# Patient Record
Sex: Female | Born: 1990 | Race: White | Hispanic: No | Marital: Single | Smoking: Never smoker
Health system: Southern US, Community
[De-identification: ages and names within clinical notes are randomized; demographics above are authoritative.]

## PROBLEM LIST (undated history)

## (undated) DIAGNOSIS — D3A00 Benign carcinoid tumor of unspecified site: Secondary | ICD-10-CM

## (undated) HISTORY — DX: Benign carcinoid tumor of unspecified site: D3A.00

---

## 2012-01-23 ENCOUNTER — Emergency Department (HOSPITAL_BASED_OUTPATIENT_CLINIC_OR_DEPARTMENT_OTHER): Payer: BC Managed Care – PPO

## 2012-01-23 ENCOUNTER — Encounter (HOSPITAL_BASED_OUTPATIENT_CLINIC_OR_DEPARTMENT_OTHER): Payer: Self-pay | Admitting: *Deleted

## 2012-01-23 ENCOUNTER — Emergency Department (HOSPITAL_BASED_OUTPATIENT_CLINIC_OR_DEPARTMENT_OTHER)
Admission: EM | Admit: 2012-01-23 | Discharge: 2012-01-23 | Disposition: A | Discharge status: Home / Self Care | Payer: BC Managed Care – PPO | Attending: Emergency Medicine | Admitting: Emergency Medicine

## 2012-01-23 DIAGNOSIS — N390 Urinary tract infection, site not specified: Secondary | ICD-10-CM | POA: Insufficient documentation

## 2012-01-23 DIAGNOSIS — R51 Headache: Secondary | ICD-10-CM | POA: Insufficient documentation

## 2012-01-23 DIAGNOSIS — R112 Nausea with vomiting, unspecified: Secondary | ICD-10-CM | POA: Insufficient documentation

## 2012-01-23 LAB — URINALYSIS, ROUTINE W REFLEX MICROSCOPIC
Bilirubin Urine: NEGATIVE
Ketones, ur: NEGATIVE mg/dL
Nitrite: NEGATIVE
pH: 5.5 (ref 5.0–8.0)

## 2012-01-23 LAB — URINE MICROSCOPIC-ADD ON

## 2012-01-23 MED ORDER — NITROFURANTOIN MONOHYD MACRO 100 MG PO CAPS
100.0000 mg | ORAL_CAPSULE | Freq: Once | ORAL | Status: AC
  Administered 2012-01-23: 100 mg via ORAL
  Filled 2012-01-23: qty 1

## 2012-01-23 MED ORDER — SODIUM CHLORIDE 0.9 % IV BOLUS (SEPSIS): 2000.0000 mL | Freq: Once | INTRAVENOUS | Status: DC

## 2012-01-23 MED ORDER — ONDANSETRON HCL 4 MG/2ML IJ SOLN
4.0000 mg | Freq: Once | INTRAMUSCULAR | Status: AC
  Administered 2012-01-23: 4 mg via INTRAVENOUS
  Filled 2012-01-23: qty 2

## 2012-01-23 MED ORDER — ONDANSETRON 8 MG PO TBDP: 8.0000 mg | ORAL_TABLET | Freq: Three times a day (TID) | ORAL | Status: AC | PRN

## 2012-01-23 MED ORDER — SODIUM CHLORIDE 0.9 % IV BOLUS (SEPSIS)
1000.0000 mL | Freq: Once | INTRAVENOUS | Status: AC
  Administered 2012-01-23: 1000 mL via INTRAVENOUS

## 2012-01-23 MED ORDER — NITROFURANTOIN MONOHYD MACRO 100 MG PO CAPS: 100.0000 mg | ORAL_CAPSULE | Freq: Two times a day (BID) | ORAL | Status: AC
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
## 2012-01-25 LAB — URINE CULTURE: Colony Count: 65000

## 2015-12-07 DIAGNOSIS — R5383 Other fatigue: Secondary | ICD-10-CM | POA: Diagnosis not present

## 2015-12-07 DIAGNOSIS — G43009 Migraine without aura, not intractable, without status migrainosus: Secondary | ICD-10-CM | POA: Diagnosis not present

## 2016-01-31 DIAGNOSIS — R319 Hematuria, unspecified: Secondary | ICD-10-CM | POA: Diagnosis not present

## 2016-01-31 DIAGNOSIS — N390 Urinary tract infection, site not specified: Secondary | ICD-10-CM | POA: Diagnosis not present

## 2016-02-19 DIAGNOSIS — J029 Acute pharyngitis, unspecified: Secondary | ICD-10-CM | POA: Diagnosis not present

## 2016-02-19 DIAGNOSIS — J069 Acute upper respiratory infection, unspecified: Secondary | ICD-10-CM | POA: Diagnosis not present

## 2016-03-07 DIAGNOSIS — R87610 Atypical squamous cells of undetermined significance on cytologic smear of cervix (ASC-US): Secondary | ICD-10-CM | POA: Diagnosis not present

## 2016-03-07 DIAGNOSIS — Z01419 Encounter for gynecological examination (general) (routine) without abnormal findings: Secondary | ICD-10-CM | POA: Diagnosis not present

## 2016-03-07 DIAGNOSIS — Z3041 Encounter for surveillance of contraceptive pills: Secondary | ICD-10-CM | POA: Diagnosis not present

## 2016-03-07 DIAGNOSIS — Z113 Encounter for screening for infections with a predominantly sexual mode of transmission: Secondary | ICD-10-CM | POA: Diagnosis not present

## 2016-03-07 DIAGNOSIS — Z124 Encounter for screening for malignant neoplasm of cervix: Secondary | ICD-10-CM | POA: Diagnosis not present

## 2016-03-10 DIAGNOSIS — Z113 Encounter for screening for infections with a predominantly sexual mode of transmission: Secondary | ICD-10-CM | POA: Diagnosis not present

## 2016-03-24 DIAGNOSIS — Z0000 Encounter for general adult medical examination without abnormal findings: Secondary | ICD-10-CM | POA: Diagnosis not present

## 2016-03-24 DIAGNOSIS — G43009 Migraine without aura, not intractable, without status migrainosus: Secondary | ICD-10-CM | POA: Diagnosis not present

## 2016-07-01 DIAGNOSIS — G43009 Migraine without aura, not intractable, without status migrainosus: Secondary | ICD-10-CM | POA: Diagnosis not present

## 2016-08-26 DIAGNOSIS — Z30430 Encounter for insertion of intrauterine contraceptive device: Secondary | ICD-10-CM | POA: Diagnosis not present

## 2016-08-26 DIAGNOSIS — Z3202 Encounter for pregnancy test, result negative: Secondary | ICD-10-CM | POA: Diagnosis not present

## 2016-10-09 DIAGNOSIS — B373 Candidiasis of vulva and vagina: Secondary | ICD-10-CM | POA: Diagnosis not present

## 2016-10-09 DIAGNOSIS — Z3202 Encounter for pregnancy test, result negative: Secondary | ICD-10-CM | POA: Diagnosis not present

## 2016-10-09 DIAGNOSIS — N898 Other specified noninflammatory disorders of vagina: Secondary | ICD-10-CM | POA: Diagnosis not present

## 2016-10-09 DIAGNOSIS — Z30431 Encounter for routine checking of intrauterine contraceptive device: Secondary | ICD-10-CM | POA: Diagnosis not present

## 2016-10-09 DIAGNOSIS — N920 Excessive and frequent menstruation with regular cycle: Secondary | ICD-10-CM | POA: Diagnosis not present

## 2017-01-11 DIAGNOSIS — H52223 Regular astigmatism, bilateral: Secondary | ICD-10-CM | POA: Diagnosis not present

## 2017-01-30 DIAGNOSIS — G43009 Migraine without aura, not intractable, without status migrainosus: Secondary | ICD-10-CM | POA: Diagnosis not present

## 2017-08-02 DIAGNOSIS — J019 Acute sinusitis, unspecified: Secondary | ICD-10-CM | POA: Diagnosis not present

## 2017-08-02 DIAGNOSIS — R05 Cough: Secondary | ICD-10-CM | POA: Diagnosis not present

## 2017-08-03 DIAGNOSIS — J111 Influenza due to unidentified influenza virus with other respiratory manifestations: Secondary | ICD-10-CM | POA: Diagnosis not present

## 2017-08-04 DIAGNOSIS — J111 Influenza due to unidentified influenza virus with other respiratory manifestations: Secondary | ICD-10-CM | POA: Diagnosis not present

## 2017-11-12 DIAGNOSIS — Z113 Encounter for screening for infections with a predominantly sexual mode of transmission: Secondary | ICD-10-CM | POA: Diagnosis not present

## 2017-11-12 DIAGNOSIS — Z3202 Encounter for pregnancy test, result negative: Secondary | ICD-10-CM | POA: Diagnosis not present

## 2017-11-12 DIAGNOSIS — N9412 Deep dyspareunia: Secondary | ICD-10-CM | POA: Diagnosis not present

## 2017-11-12 DIAGNOSIS — Z30432 Encounter for removal of intrauterine contraceptive device: Secondary | ICD-10-CM | POA: Diagnosis not present

## 2017-11-12 DIAGNOSIS — N946 Dysmenorrhea, unspecified: Secondary | ICD-10-CM | POA: Diagnosis not present

## 2017-11-12 DIAGNOSIS — R140 Abdominal distension (gaseous): Secondary | ICD-10-CM | POA: Diagnosis not present

## 2018-01-08 DIAGNOSIS — G44219 Episodic tension-type headache, not intractable: Secondary | ICD-10-CM | POA: Diagnosis not present

## 2018-01-08 DIAGNOSIS — G43009 Migraine without aura, not intractable, without status migrainosus: Secondary | ICD-10-CM | POA: Diagnosis not present

## 2018-01-08 DIAGNOSIS — R51 Headache: Secondary | ICD-10-CM | POA: Diagnosis not present

## 2018-01-12 DIAGNOSIS — J309 Allergic rhinitis, unspecified: Secondary | ICD-10-CM | POA: Diagnosis not present

## 2018-01-13 DIAGNOSIS — F411 Generalized anxiety disorder: Secondary | ICD-10-CM | POA: Diagnosis not present

## 2018-01-13 DIAGNOSIS — G43009 Migraine without aura, not intractable, without status migrainosus: Secondary | ICD-10-CM | POA: Diagnosis not present

## 2018-01-13 DIAGNOSIS — Z0000 Encounter for general adult medical examination without abnormal findings: Secondary | ICD-10-CM | POA: Diagnosis not present

## 2018-01-13 DIAGNOSIS — G44219 Episodic tension-type headache, not intractable: Secondary | ICD-10-CM | POA: Diagnosis not present

## 2018-05-11 DIAGNOSIS — J069 Acute upper respiratory infection, unspecified: Secondary | ICD-10-CM | POA: Diagnosis not present

## 2018-06-17 DIAGNOSIS — H5213 Myopia, bilateral: Secondary | ICD-10-CM | POA: Diagnosis not present

## 2018-06-17 DIAGNOSIS — H52223 Regular astigmatism, bilateral: Secondary | ICD-10-CM | POA: Diagnosis not present

## 2018-06-21 DIAGNOSIS — H5210 Myopia, unspecified eye: Secondary | ICD-10-CM | POA: Diagnosis not present

## 2018-11-03 DIAGNOSIS — D485 Neoplasm of uncertain behavior of skin: Secondary | ICD-10-CM | POA: Diagnosis not present

## 2018-11-03 DIAGNOSIS — D225 Melanocytic nevi of trunk: Secondary | ICD-10-CM | POA: Diagnosis not present

## 2018-11-03 DIAGNOSIS — I781 Nevus, non-neoplastic: Secondary | ICD-10-CM | POA: Diagnosis not present

## 2019-01-18 DIAGNOSIS — Z0000 Encounter for general adult medical examination without abnormal findings: Secondary | ICD-10-CM | POA: Diagnosis not present

## 2019-01-25 DIAGNOSIS — D485 Neoplasm of uncertain behavior of skin: Secondary | ICD-10-CM | POA: Diagnosis not present

## 2019-01-25 DIAGNOSIS — I781 Nevus, non-neoplastic: Secondary | ICD-10-CM | POA: Diagnosis not present

## 2019-03-21 DIAGNOSIS — M9903 Segmental and somatic dysfunction of lumbar region: Secondary | ICD-10-CM | POA: Diagnosis not present

## 2019-03-21 DIAGNOSIS — M5411 Radiculopathy, occipito-atlanto-axial region: Secondary | ICD-10-CM | POA: Diagnosis not present

## 2019-03-21 DIAGNOSIS — M9901 Segmental and somatic dysfunction of cervical region: Secondary | ICD-10-CM | POA: Diagnosis not present

## 2019-03-23 DIAGNOSIS — M9901 Segmental and somatic dysfunction of cervical region: Secondary | ICD-10-CM | POA: Diagnosis not present

## 2019-03-23 DIAGNOSIS — M9903 Segmental and somatic dysfunction of lumbar region: Secondary | ICD-10-CM | POA: Diagnosis not present

## 2019-03-23 DIAGNOSIS — M5411 Radiculopathy, occipito-atlanto-axial region: Secondary | ICD-10-CM | POA: Diagnosis not present

## 2019-03-28 DIAGNOSIS — M9903 Segmental and somatic dysfunction of lumbar region: Secondary | ICD-10-CM | POA: Diagnosis not present

## 2019-03-28 DIAGNOSIS — M5411 Radiculopathy, occipito-atlanto-axial region: Secondary | ICD-10-CM | POA: Diagnosis not present

## 2019-03-28 DIAGNOSIS — M9901 Segmental and somatic dysfunction of cervical region: Secondary | ICD-10-CM | POA: Diagnosis not present

## 2019-04-23 DIAGNOSIS — J019 Acute sinusitis, unspecified: Secondary | ICD-10-CM | POA: Diagnosis not present

## 2019-04-23 DIAGNOSIS — U071 COVID-19: Secondary | ICD-10-CM | POA: Diagnosis not present

## 2019-06-21 DIAGNOSIS — H5213 Myopia, bilateral: Secondary | ICD-10-CM | POA: Diagnosis not present

## 2019-06-21 DIAGNOSIS — H52223 Regular astigmatism, bilateral: Secondary | ICD-10-CM | POA: Diagnosis not present

## 2019-07-14 DIAGNOSIS — H5210 Myopia, unspecified eye: Secondary | ICD-10-CM | POA: Diagnosis not present

## 2019-07-27 DIAGNOSIS — Z01419 Encounter for gynecological examination (general) (routine) without abnormal findings: Secondary | ICD-10-CM | POA: Diagnosis not present

## 2019-07-27 DIAGNOSIS — Z6834 Body mass index (BMI) 34.0-34.9, adult: Secondary | ICD-10-CM | POA: Diagnosis not present

## 2019-07-27 DIAGNOSIS — Z124 Encounter for screening for malignant neoplasm of cervix: Secondary | ICD-10-CM | POA: Diagnosis not present

## 2019-10-04 DIAGNOSIS — Z1283 Encounter for screening for malignant neoplasm of skin: Secondary | ICD-10-CM | POA: Diagnosis not present

## 2019-10-04 DIAGNOSIS — D225 Melanocytic nevi of trunk: Secondary | ICD-10-CM | POA: Diagnosis not present

## 2019-10-04 DIAGNOSIS — I781 Nevus, non-neoplastic: Secondary | ICD-10-CM | POA: Diagnosis not present

## 2019-10-20 DIAGNOSIS — J0100 Acute maxillary sinusitis, unspecified: Secondary | ICD-10-CM | POA: Diagnosis not present

## 2020-01-18 DIAGNOSIS — J0110 Acute frontal sinusitis, unspecified: Secondary | ICD-10-CM | POA: Diagnosis not present

## 2020-03-30 DIAGNOSIS — J40 Bronchitis, not specified as acute or chronic: Secondary | ICD-10-CM | POA: Diagnosis not present

## 2020-03-30 DIAGNOSIS — B9689 Other specified bacterial agents as the cause of diseases classified elsewhere: Secondary | ICD-10-CM | POA: Diagnosis not present

## 2020-03-30 DIAGNOSIS — J329 Chronic sinusitis, unspecified: Secondary | ICD-10-CM | POA: Diagnosis not present

## 2020-03-30 DIAGNOSIS — Z299 Encounter for prophylactic measures, unspecified: Secondary | ICD-10-CM | POA: Diagnosis not present

## 2020-05-31 DIAGNOSIS — Z20822 Contact with and (suspected) exposure to covid-19: Secondary | ICD-10-CM | POA: Diagnosis not present

## 2020-05-31 DIAGNOSIS — B349 Viral infection, unspecified: Secondary | ICD-10-CM | POA: Diagnosis not present

## 2020-05-31 DIAGNOSIS — R059 Cough, unspecified: Secondary | ICD-10-CM | POA: Diagnosis not present

## 2020-05-31 DIAGNOSIS — R5383 Other fatigue: Secondary | ICD-10-CM | POA: Diagnosis not present

## 2020-06-18 DIAGNOSIS — B349 Viral infection, unspecified: Secondary | ICD-10-CM | POA: Diagnosis not present

## 2020-06-18 DIAGNOSIS — Z20822 Contact with and (suspected) exposure to covid-19: Secondary | ICD-10-CM | POA: Diagnosis not present

## 2020-06-18 DIAGNOSIS — R059 Cough, unspecified: Secondary | ICD-10-CM | POA: Diagnosis not present

## 2020-06-28 DIAGNOSIS — J40 Bronchitis, not specified as acute or chronic: Secondary | ICD-10-CM | POA: Diagnosis not present

## 2021-08-28 ENCOUNTER — Emergency Department (HOSPITAL_COMMUNITY): Payer: Self-pay

## 2021-08-28 ENCOUNTER — Other Ambulatory Visit: Payer: Self-pay

## 2021-08-28 ENCOUNTER — Inpatient Hospital Stay (HOSPITAL_COMMUNITY)
Admission: EM | Admit: 2021-08-28 | Discharge: 2021-08-30 | DRG: 166 | Disposition: A | Discharge status: Home / Self Care | Payer: Self-pay | Attending: Internal Medicine | Admitting: Internal Medicine

## 2021-08-28 ENCOUNTER — Encounter (HOSPITAL_COMMUNITY): Payer: Self-pay

## 2021-08-28 DIAGNOSIS — R918 Other nonspecific abnormal finding of lung field: Principal | ICD-10-CM | POA: Diagnosis present

## 2021-08-28 DIAGNOSIS — Z6833 Body mass index (BMI) 33.0-33.9, adult: Secondary | ICD-10-CM

## 2021-08-28 DIAGNOSIS — J189 Pneumonia, unspecified organism: Secondary | ICD-10-CM | POA: Diagnosis present

## 2021-08-28 DIAGNOSIS — E669 Obesity, unspecified: Secondary | ICD-10-CM | POA: Diagnosis present

## 2021-08-28 DIAGNOSIS — J90 Pleural effusion, not elsewhere classified: Secondary | ICD-10-CM | POA: Diagnosis present

## 2021-08-28 DIAGNOSIS — R000 Tachycardia, unspecified: Secondary | ICD-10-CM | POA: Diagnosis present

## 2021-08-28 DIAGNOSIS — F419 Anxiety disorder, unspecified: Secondary | ICD-10-CM | POA: Diagnosis present

## 2021-08-28 DIAGNOSIS — E876 Hypokalemia: Secondary | ICD-10-CM | POA: Diagnosis present

## 2021-08-28 DIAGNOSIS — Z882 Allergy status to sulfonamides status: Secondary | ICD-10-CM

## 2021-08-28 DIAGNOSIS — Z20822 Contact with and (suspected) exposure to covid-19: Secondary | ICD-10-CM | POA: Diagnosis present

## 2021-08-28 DIAGNOSIS — I10 Essential (primary) hypertension: Secondary | ICD-10-CM | POA: Diagnosis present

## 2021-08-28 LAB — BASIC METABOLIC PANEL
Anion gap: 15 (ref 5–15)
BUN: <5 mg/dL — ABNORMAL LOW (ref 6–20)
CO2: 24 mmol/L (ref 22–32)
Calcium: 9 mg/dL (ref 8.9–10.3)
Chloride: 97 mmol/L — ABNORMAL LOW (ref 98–111)
Creatinine, Ser: 0.67 mg/dL (ref 0.44–1.00)
GFR, Estimated: >60 mL/min (ref >60)
Glucose, Bld: 118 mg/dL — ABNORMAL HIGH (ref 70–99)
Potassium: 3.5 mmol/L (ref 3.5–5.1)
Sodium: 136 mmol/L (ref 135–145)

## 2021-08-28 LAB — CBC WITH DIFFERENTIAL/PLATELET
Abs Immature Granulocytes: 0.18 10*3/uL — ABNORMAL HIGH (ref 0.00–0.07)
Basophils Absolute: 0.1 10*3/uL (ref 0.0–0.1)
Basophils Relative: 0 %
Eosinophils Absolute: 0 10*3/uL (ref 0.0–0.5)
Eosinophils Relative: 0 %
HCT: 40.1 % (ref 36.0–46.0)
Hemoglobin: 13.1 g/dL (ref 12.0–15.0)
Immature Granulocytes: 1 %
Lymphocytes Relative: 7 %
Lymphs Abs: 1.7 10*3/uL (ref 0.7–4.0)
MCH: 28.9 pg (ref 26.0–34.0)
MCHC: 32.7 g/dL (ref 30.0–36.0)
MCV: 88.5 fL (ref 80.0–100.0)
Monocytes Absolute: 1.3 10*3/uL — ABNORMAL HIGH (ref 0.1–1.0)
Monocytes Relative: 5 %
Neutro Abs: 21.8 10*3/uL — ABNORMAL HIGH (ref 1.7–7.7)
Neutrophils Relative %: 87 %
Platelets: 502 10*3/uL — ABNORMAL HIGH (ref 150–400)
RBC: 4.53 MIL/uL (ref 3.87–5.11)
RDW: 12.6 % (ref 11.5–15.5)
WBC: 25 10*3/uL — ABNORMAL HIGH (ref 4.0–10.5)
nRBC: 0 % (ref 0.0–0.2)

## 2021-08-28 LAB — HEPATIC FUNCTION PANEL
ALT: 18 U/L (ref 0–44)
AST: 19 U/L (ref 15–41)
Albumin: 2.9 g/dL — ABNORMAL LOW (ref 3.5–5.0)
Alkaline Phosphatase: 104 U/L (ref 38–126)
Bilirubin, Direct: 0.1 mg/dL (ref 0.0–0.2)
Indirect Bilirubin: 0.5 mg/dL (ref 0.3–0.9)
Total Bilirubin: 0.6 mg/dL (ref 0.3–1.2)
Total Protein: 7.3 g/dL (ref 6.5–8.1)

## 2021-08-28 LAB — TROPONIN I (HIGH SENSITIVITY)
Troponin I (High Sensitivity): 11 ng/L (ref <18)
Troponin I (High Sensitivity): 9 ng/L (ref <18)

## 2021-08-28 LAB — URINALYSIS, ROUTINE W REFLEX MICROSCOPIC
Bilirubin Urine: NEGATIVE
Glucose, UA: NEGATIVE mg/dL
Ketones, ur: 20 mg/dL — AB
Leukocytes,Ua: NEGATIVE
Nitrite: NEGATIVE
Protein, ur: NEGATIVE mg/dL
Specific Gravity, Urine: 1.021 (ref 1.005–1.030)
pH: 7 (ref 5.0–8.0)

## 2021-08-28 LAB — LACTIC ACID, PLASMA: Lactic Acid, Venous: 1.4 mmol/L (ref 0.5–1.9)

## 2021-08-28 LAB — I-STAT BETA HCG BLOOD, ED (MC, WL, AP ONLY): I-stat hCG, quantitative: 10.4 m[IU]/mL — ABNORMAL HIGH (ref <5)

## 2021-08-28 LAB — PROTIME-INR
INR: 1.5 — ABNORMAL HIGH (ref 0.8–1.2)
Prothrombin Time: 18.4 seconds — ABNORMAL HIGH (ref 11.4–15.2)

## 2021-08-28 LAB — RESP PANEL BY RT-PCR (FLU A&B, COVID) ARPGX2
Influenza A by PCR: NEGATIVE
Influenza B by PCR: NEGATIVE
SARS Coronavirus 2 by RT PCR: NEGATIVE

## 2021-08-28 LAB — PREGNANCY, URINE: Preg Test, Ur: NEGATIVE

## 2021-08-28 LAB — APTT: aPTT: 29 seconds (ref 24–36)

## 2021-08-28 MED ORDER — ACETAMINOPHEN 500 MG PO TABS
1000.0000 mg | ORAL_TABLET | Freq: Once | ORAL | Status: AC
Start: 2021-08-28 — End: 2021-08-28
  Administered 2021-08-28: 1000 mg via ORAL
  Filled 2021-08-28: qty 2

## 2021-08-28 MED ORDER — SODIUM CHLORIDE 0.9% FLUSH
3.0000 mL | Freq: Two times a day (BID) | INTRAVENOUS | Status: DC
  Administered 2021-08-29 – 2021-08-30 (×3): 3 mL via INTRAVENOUS

## 2021-08-28 MED ORDER — LACTATED RINGERS IV BOLUS (SEPSIS)
1000.0000 mL | Freq: Once | INTRAVENOUS | Status: AC
  Administered 2021-08-28: 1000 mL via INTRAVENOUS

## 2021-08-28 MED ORDER — LACTATED RINGERS IV SOLN
INTRAVENOUS | Status: AC
Start: 2021-08-28 — End: 2021-08-29

## 2021-08-28 MED ORDER — SODIUM CHLORIDE 0.9 % IV SOLN: 500.0000 mg | INTRAVENOUS | Status: DC

## 2021-08-28 MED ORDER — ONDANSETRON HCL 4 MG/2ML IJ SOLN: 4.0000 mg | Freq: Four times a day (QID) | INTRAMUSCULAR | Status: DC | PRN

## 2021-08-28 MED ORDER — ACETAMINOPHEN 325 MG PO TABS: 650.0000 mg | ORAL_TABLET | Freq: Four times a day (QID) | ORAL | Status: DC | PRN

## 2021-08-28 MED ORDER — IOHEXOL 350 MG/ML SOLN
65.0000 mL | Freq: Once | INTRAVENOUS | Status: AC | PRN
  Administered 2021-08-28: 65 mL via INTRAVENOUS

## 2021-08-28 MED ORDER — GUAIFENESIN ER 600 MG PO TB12
600.0000 mg | ORAL_TABLET | Freq: Two times a day (BID) | ORAL | Status: DC | PRN
  Administered 2021-08-29 (×2): 600 mg via ORAL
  Filled 2021-08-28 (×2): qty 1

## 2021-08-28 MED ORDER — SODIUM CHLORIDE 0.9 % IV SOLN
1.0000 g | Freq: Once | INTRAVENOUS | Status: AC
  Administered 2021-08-28: 1 g via INTRAVENOUS
  Filled 2021-08-28: qty 10

## 2021-08-28 MED ORDER — ACETAMINOPHEN 650 MG RE SUPP: 650.0000 mg | Freq: Four times a day (QID) | RECTAL | Status: DC | PRN

## 2021-08-28 MED ORDER — SODIUM CHLORIDE 0.9 % IV SOLN: 2.0000 g | Freq: Once | INTRAVENOUS | Status: DC

## 2021-08-28 MED ORDER — SODIUM CHLORIDE 0.9 % IV SOLN
2.0000 g | INTRAVENOUS | Status: DC
  Administered 2021-08-29: 2 g via INTRAVENOUS
  Filled 2021-08-28: qty 20

## 2021-08-28 MED ORDER — SODIUM CHLORIDE 0.9 % IV SOLN
500.0000 mg | INTRAVENOUS | Status: DC
  Administered 2021-08-28 – 2021-08-30 (×3): 500 mg via INTRAVENOUS
  Filled 2021-08-28 (×3): qty 5

## 2021-08-28 MED ORDER — ONDANSETRON HCL 4 MG PO TABS: 4.0000 mg | ORAL_TABLET | Freq: Four times a day (QID) | ORAL | Status: DC | PRN

## 2021-08-28 MED ORDER — SENNOSIDES-DOCUSATE SODIUM 8.6-50 MG PO TABS: 1.0000 | ORAL_TABLET | Freq: Every evening | ORAL | Status: DC | PRN

## 2021-08-28 MED ORDER — LACTATED RINGERS IV BOLUS: 1000.0000 mL | Freq: Once | INTRAVENOUS | Status: DC

## 2021-08-28 NOTE — H&P (View-Only) (Signed)
? ?NAME:  Heidi Gomez, MRN:  [id number], DOB:  09-19-1990, LOS: 0 ?ADMISSION DATE:  08/28/2021, CONSULTATION DATE: 08/28/2021 ?REFERRING MD: Dr. Posey Pronto, CHIEF COMPLAINT: Lung mass ? ?History of Present Illness:  ?This is a 31 year old female, no real significant past medical history.  Works from home.  Has no real significant exposures.  Non-smoker.  She had cough shortness of breath and a recent episode of back pain.  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  She also has associated mediastinal adenopathy concerning for malignancy.  Pulmonary was consulted for recommendations regarding bronchoscopy. ? ?Pertinent  Medical History  ?History reviewed. No pertinent past medical history.  ? ?Significant Hospital Events: ?Including procedures, antibiotic start and stop dates in addition to other pertinent events   ? ? ?Interim History / Subjective:  ?This is a 31 year old female with a left lower lobe mass. ? ?Objective   ?Blood pressure 134/84, pulse (!) 132, temperature (!) 100.6 ?F (38.1 ?C), temperature source Oral, resp. rate 19, height '5\' 2"'$  (1.575 m), weight 83.9 kg, SpO2 92 %. ?   ?   ? ?Intake/Output Summary (Last 24 hours) at 08/28/2021 1820 ?Last data filed at 08/28/2021 1812 ?Gross per 24 hour  ?Intake 3350 ml  ?Output --  ?Net 3350 ml  ? ?Filed Weights  ? 08/28/21 1426  ?Weight: 83.9 kg  ? ? ?Examination: ?General: Young female resting in bed. ?HENT: NCAT, tracking appropriately ?Lungs: Clear to auscultation bilaterally, diminished in the left lower lobe ?Cardiovascular: Regular rate rhythm, S1-S2 ?Abdomen: Soft, nontender nondistended ?Extremities: No significant edema ?Neuro: Alert oriented following commands ?GU: Deferred ? ?Resolved Hospital Problem list   ? ? 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 ? ?Plan: ?N.p.o. midnight ?Bronchoscopy scheduled for tomorrow. ?Likely include endobronchial cryobiopsies and tumor debulking for relief of left lower lobe obstruction. ?Also will need video bronchoscopy with endobronchial ultrasound and staging of the station 7 mediastinal adenopathy. ?I have placed a case request to be [place]. ?Hopefully will be able to schedule for tomorrow. ? ? ?Labs   ?CBC: ?Recent Labs  ?Lab 08/28/21 ?1435  ?WBC 25.0*  ?NEUTROABS 21.8*  ?HGB 13.1  ?HCT 40.1  ?MCV 88.5  ?PLT 502*  ? ? ?Basic Metabolic Panel: ?Recent Labs  ?Lab 08/28/21 ?1435  ?NA 136  ?K 3.5  ?CL 97*  ?CO2 24  ?GLUCOSE 118*  ?BUN <5*  ?CREATININE 0.67  ?CALCIUM 9.0  ? ?GFR: ?Estimated Creatinine Clearance: 103.2 mL/min (by C-G formula based on SCr of 0.67 mg/dL). ?Recent Labs  ?Lab 08/28/21 ?1435 08/28/21 ?1553  ?WBC 25.0*  --   ?LATICACIDVEN  --  1.4  ? ? ?Liver Function Tests: ?Recent Labs  ?Lab 08/28/21 ?1553  ?AST 19  ?ALT 18  ?ALKPHOS 104  ?BILITOT 0.6  ?PROT 7.3  ?ALBUMIN 2.9*  ? ?No results for input(s): LIPASE, AMYLASE in the last 168 hours. ?No results for input(s): AMMONIA in the last 168 hours. ? ?ABG ?No results found for: PHART, PCO2ART, PO2ART, HCO3, TCO2, ACIDBASEDEF, O2SAT  ? ?Coagulation Profile: ?Recent Labs  ?Lab 08/28/21 ?1553  ?INR 1.5*  ? ? ?Cardiac Enzymes: ?No results for input(s): CKTOTAL, CKMB, CKMBINDEX, TROPONINI in the last 168 hours. ? ?HbA1C: ?No results found for: HGBA1C ? ?CBG: ?No results for input(s): GLUCAP in the last  168 hours. ? ?Review of Systems:   ?Review of Systems  ?Constitutional:  Negative for chills, fever, malaise/fatigue and weight loss.  ?HENT:  Negative for hearing loss, sore throat and tinnitus.   ?Eyes:  Negative for blurred vision and double vision.  ?Respiratory:  Positive for cough and shortness of breath. Negative for hemoptysis, sputum production, wheezing and stridor.   ?Cardiovascular:  Negative for chest pain,  palpitations, orthopnea, leg swelling and PND.  ?Gastrointestinal:  Negative for abdominal pain, constipation, diarrhea, heartburn, nausea and vomiting.  ?Genitourinary:  Negative for dysuria, hematuria and urgency.  ?Musculoskeletal:  Negative for joint pain and myalgias.  ?Skin:  Negative for itching and rash.  ?Neurological:  Negative for dizziness, tingling, weakness and headaches.  ?Endo/Heme/Allergies:  Negative for environmental allergies. Does not bruise/bleed easily.  ?Psychiatric/Behavioral:  Negative for depression. The patient is not nervous/anxious and does not have insomnia.   ?All other systems reviewed and are negative.  ? ?Past Medical History:  ?She,  has no past medical history on file.  ? ?Surgical History:  ?History reviewed. No pertinent surgical history.  ? ?Social History:  ?   ? ?Family History:  ?Her family history is not on file.  ? ?Allergies ?Allergies  ?Allergen Reactions  ? Sulfa Antibiotics Hives  ?  ? ?Home Medications  ?Prior to Admission medications   ?Not on File  ? ? ? ? ?Garner Nash, DO ?[facility] ?08/28/2021 6:20 PM   ?

## 2021-08-28 NOTE — ED Notes (Signed)
Per MD Physiologic tachycardia - continue care plan  ? ?

## 2021-08-28 NOTE — H&P (Signed)
?History and Physical  ? ? ?Heidi Gomez HLK:[id number] DOB: [birth year] DOA: 08/28/2021 ? ?PCP: Pcp, No  ?Patient coming from: Home via urgent care ? ?I have personally briefly reviewed patient's old medical records in [place] ? ?Chief Complaint: Shortness of breath, cough ? ?HPI: ?Heidi Gomez is a 31 y.o. female with no known significant medical history who presented to the ED for evaluation of shortness of breath. ? ?Patient reports 5-6 days of new onset shortness of breath with productive cough, pain to left lower back with deep inspiration, chills, diaphoresis, fatigue, and nausea.  She says that she has had COVID 4 times since the beginning of the pandemic with last time in February.  She went to urgent care today for further evaluation.  Chest x-ray was performed and showed moderate left pleural effusion with left lower lobe airspace opacity.  Patient was noted to have sinus tachycardia with rate 138 bpm by EKG.  There was concern for PE and she was sent to the ED for further evaluation. ? ?Patient denies any known chronic medical conditions.  She does not take any medications regularly other than migraine medication as needed.  She denies any history of tobacco, alcohol, illicit drug use.  She reports an allergy to sulfa which causes her to break out in hives.  She reports a history of bladder cancer in her grandfather. ? ?ED Course  Labs/Imaging on admission: I have personally reviewed following labs and imaging studies. ? ?Initial vitals showed BP 151/94, pulse 153, RR 21, temp 100.6 ?F, SPO2 97% on room air. ? ?Labs show WBC 25.0, hemoglobin 13.1, platelets 502,000, sodium 136, potassium 3.5, bicarb 24, BUN <5, creatinine 0.67, serum glucose 118, troponin 11, lactic acid 1.4, i-STAT beta-hCG 10.4. ? ?SARS-CoV-2 and influenza PCR negative.  Blood cultures in process. ? ?Portable chest x-ray showed homogenous opacification of inferior 40% of the left lung. ? ?CTA chest PE study showed collapse and  consolidation throughout the left lower lobe which appears to be related to an endobronchial lesion and inclusion.  Enlarged mediastinal lymph nodes and concern for abnormal soft tissue in the left hilum seen.  Findings concerning for neoplastic process.  Negative for PE. ? ?Patient was given 3 L LR, IV ceftriaxone and azithromycin.  EDP consulted pulmonology who recommended medical admission and they will arrange for further evaluation of endobronchial mass.  The hospitalist service was consulted to admit for further evaluation and management. ? ?Review of Systems: All systems reviewed and are negative except as documented in history of present illness above. ? ? ?History reviewed. No pertinent past medical history. ? ?History reviewed. No pertinent surgical history. ? ?Social History: ? has no history on file for tobacco use, alcohol use, and drug use. ? ?Allergies  ?Allergen Reactions  ? Sulfa Antibiotics Hives  ? ? ?History reviewed. No pertinent family history. ? ? ?Prior to Admission medications   ?Not on File  ? ? ?Physical Exam: ?Vitals:  ? 08/28/21 1800 08/28/21 1815 08/28/21 1830 08/28/21 1845  ?BP:  (!) 147/91 (!) 149/86 (!) 157/94  ?Pulse: (!) 144 (!) 142 (!) 134 (!) 144  ?Resp: (!) 32 (!) '27 17 20  '$ ?Temp:      ?TempSrc:      ?SpO2: 96% 95% 95% 95%  ?Weight:      ?Height:      ? ?Constitutional: Resting in bed, NAD, calm, comfortable ?Eyes: PERRL, lids and conjunctivae normal ?ENMT: Mucous membranes are moist. Posterior pharynx clear of  any exudate or lesions.Normal dentition.  ?Neck: normal, supple, no masses. ?Respiratory: clear to auscultation bilaterally, no wheezing, no crackles. Normal respiratory effort. No accessory muscle use.  ?Cardiovascular: Tachycardic, no murmurs / rubs / gallops. No extremity edema. 2+ pedal pulses. ?Abdomen: no tenderness, no masses palpated. No hepatosplenomegaly.  ?Musculoskeletal: no clubbing / cyanosis. No joint deformity upper and lower extremities. Good ROM, no  contractures. Normal muscle tone.  ?Skin: no rashes, lesions, ulcers. No induration ?Neurologic: CN 2-12 grossly intact. Sensation intact. Strength 5/5 in all 4.  ?Psychiatric: Normal judgment and insight. Alert and oriented x 3. Normal mood.  ? ?EKG: Personally reviewed. Sinus tachycardia, rate 158, no prior for comparison. ? ?Assessment/Plan ?Principal Problem: ?  Endobronchial mass ?  ?Heidi Gomez is a 31 y.o. female with no known significant medical history who is admitted with endobronchial mass associated with left lower lobe collapse and consolidation concerning for neoplastic process with possible postobstructive pneumonia.  Pulmonology to consult. ? ?Assessment and Plan: ?* Endobronchial mass ?CTA chest shows occluding lesion near the origin of the left lower lobe bronchus associated with collapse and consolidation in the left lower lobe.  Findings concerning for neoplastic process.  This was discussed with patient and family at bedside.  Given fever, leukocytosis, tachycardia we will continue antibiotics for treatment of possible postobstructive pneumonia. ?-Continue IV ceftriaxone and azithromycin ?-Follow blood cultures ?-Keep n.p.o. after midnight ?-Pulmonology to consult for further recommendation ? ?DVT prophylaxis: SCDs Start: 08/28/21 1858 ?Code Status: Full code ?Family Communication: Discussed with patient's mother and brother at bedside ?Disposition Plan: From home, dispo pending clinical progress ?Consults called: PCCM ?Severity of Illness: ?The appropriate patient status for this patient is INPATIENT. Inpatient status is judged to be reasonable and necessary in order to provide the required intensity of service to ensure the patient's safety. The patient's presenting symptoms, physical exam findings, and initial radiographic and laboratory data in the context of their chronic comorbidities is felt to place them at high risk for further clinical deterioration. Furthermore, it is not  anticipated that the patient will be medically stable for discharge from the hospital within 2 midnights of admission.  ? ?* I certify that at the point of admission it is my clinical judgment that the patient will require inpatient hospital care spanning beyond 2 midnights from the point of admission due to high intensity of service, high risk for further deterioration and high frequency of surveillance required.*  ?Zada Finders MD ?[facility] ? ?If 7PM-7AM, please contact night-coverage ?[email or web address] ? ?08/28/2021, 7:09 PM  ?

## 2021-08-28 NOTE — Assessment & Plan Note (Signed)
CTA chest shows occluding lesion near the origin of the left lower lobe bronchus associated with collapse and consolidation in the left lower lobe.  Findings concerning for neoplastic process.  This was discussed with patient and family at bedside.  Given fever, leukocytosis, tachycardia we will continue antibiotics for treatment of possible postobstructive pneumonia. ?-Continue IV ceftriaxone and azithromycin ?-Follow blood cultures ?-Keep n.p.o. after midnight ?-Pulmonology to consult for further recommendation ?

## 2021-08-28 NOTE — Sepsis Progress Note (Signed)
Notified bedside nurse of need to draw lactic acid and Blood Cultures.. 

## 2021-08-28 NOTE — ED Provider Triage Note (Signed)
Emergency Medicine Provider Triage Evaluation Note ? ?Heidi Gomez , a 31 y.o. female  was evaluated in triage.  Pt complains of shortness of breath and fever.  Patient was initially seen at urgent care for flulike symptoms that started about 1-1/2 weeks ago with associated shortness of breath, cough, and left-sided back pain.  She is found to be tachycardic at urgent care and her chest x-ray showed a moderate left pleural effusion with left lower lobe airspace opacity concerning for pneumonia.  Due to patient's tachycardia and dyspnea on exertion, urgent care sent here for evaluation of a pulmonary embolism. ?History of blood clots.  She is not on oral contraceptives.  Denies any recent long travel. ? ?Review of Systems  ?Positive: Left-sided back pain, cough, fever, dyspnea on exertion ?Negative: Chest pain ? ?Physical Exam  ?BP (!) 151/94   Pulse (!) 153   Temp (!) 100.6 ?F (38.1 ?C) (Oral)   Resp (!) 21   Ht '5\' 2"'$  (1.575 m)   Wt 83.9 kg   SpO2 97%   BMI 33.84 kg/m?  ?Gen:   Awake, no distress   ?Resp:  Normal effort  ?MSK:   Moves extremities without difficulty  ?Other:  Patient appears flushed, productive cough ? ?Medical Decision Making  ?Medically screening exam initiated at 2:27 PM.  Appropriate orders placed.  Heidi Gomez was informed that the remainder of the evaluation will be completed by another provider, this initial triage assessment does not replace that evaluation, and the importance of remaining in the ED until their evaluation is complete. ? ?Patient was noted to be febrile and tachycardic to 153 in triage, notified staff patient will require next room.  ?  Kateri Plummer, PA-C ?08/28/21 1427 ? ?

## 2021-08-28 NOTE — ED Provider Notes (Signed)
?[facility] ?Provider Note ? ? ?CSN: [id number] ?Arrival date & time: 08/28/21  1339 ? ?  ? ?History ? ?Chief Complaint  ?Patient presents with  ? Chest Pain  ? Shortness of Breath  ? ? ?Heidi Gomez is a 31 y.o. female with no reported past medical history presenting today due to shortness of breath.  She reports that on Thursday or Friday she felt as though she had a fever, chills, nausea and other flulike symptoms.  This morning she decided to go to urgent care because she still had a lingering cough and some dyspnea on exertion.  She figured it may be pneumonia.  When she was at urgent care they did an x-ray and because she was tachycardic and had dyspnea on exertion, they sent her here to rule out a PE.  No history of DVT/PE.  No OCPs.  No recent travel or surgeries.  Currently denies any palpitations or chest pain.  Reports feeling slightly short of breath, but mostly only on exertion. ? ? ?Home Medications ?Prior to Admission medications   ?Not on File  ?   ? ?Allergies    ?Sulfa antibiotics   ? ?Review of Systems   ?Review of Systems  ?Constitutional:  Positive for chills and fever.  ?Respiratory:  Positive for cough and shortness of breath.   ?Cardiovascular:  Negative for chest pain.  ?Musculoskeletal:  Positive for myalgias.  ?See HPI for more ? ?Physical Exam ?Updated Vital Signs ?BP (!) 151/94   Pulse (!) 153   Temp (!) 100.6 ?F (38.1 ?C) (Oral)   Resp (!) 21   Ht '5\' 2"'$  (1.575 m)   Wt 83.9 kg   SpO2 97%   BMI 33.84 kg/m?  ?Physical Exam ?Vitals and nursing note reviewed.  ?Constitutional:   ?   General: She is not in acute distress. ?   Appearance: Normal appearance. She is not ill-appearing.  ?HENT:  ?   Head: Normocephalic and atraumatic.  ?Eyes:  ?   General: No scleral icterus. ?   Conjunctiva/sclera: Conjunctivae normal.  ?Cardiovascular:  ?   Rate and Rhythm: Normal rate and regular rhythm.  ?Pulmonary:  ?   Effort: Pulmonary effort is normal.  Tachypnea present. No accessory muscle usage or respiratory distress.  ?   Breath sounds: Examination of the right-upper field reveals wheezing. Examination of the left-upper field reveals decreased breath sounds and wheezing. Examination of the left-lower field reveals decreased breath sounds. Decreased breath sounds and wheezing present. No rhonchi.  ?Skin: ?   Findings: No rash.  ?Neurological:  ?   Mental Status: She is alert.  ?Psychiatric:     ?   Mood and Affect: Mood normal.     ?   Behavior: Behavior normal.  ? ? ?ED Results / Procedures / Treatments   ?Labs ?(all labs ordered are listed, but only abnormal results are displayed) ?Labs Reviewed  ?CBC WITH DIFFERENTIAL/PLATELET - Abnormal; Notable for the following components:  ?    Result Value  ? WBC 25.0 (*)   ? Platelets 502 (*)   ? Neutro Abs 21.8 (*)   ? Monocytes Absolute 1.3 (*)   ? Abs Immature Granulocytes 0.18 (*)   ? All other components within normal limits  ?BASIC METABOLIC PANEL - Abnormal; Notable for the following components:  ? Chloride 97 (*)   ? Glucose, Bld 118 (*)   ? BUN <5 (*)   ? All other components within normal limits  ?  PROTIME-INR - Abnormal; Notable for the following components:  ? Prothrombin Time 18.4 (*)   ? INR 1.5 (*)   ? All other components within normal limits  ?HEPATIC FUNCTION PANEL - Abnormal; Notable for the following components:  ? Albumin 2.9 (*)   ? All other components within normal limits  ?I-STAT BETA HCG BLOOD, ED ([facility], [facility], [facility] ONLY) - Abnormal; Notable for the following components:  ? I-stat hCG, quantitative 10.4 (*)   ? All other components within normal limits  ?RESP PANEL BY RT-PCR (FLU A&B, COVID) ARPGX2  ?CULTURE, BLOOD (ROUTINE X 2)  ?CULTURE, BLOOD (ROUTINE X 2)  ?LACTIC ACID, PLASMA  ?APTT  ?LACTIC ACID, PLASMA  ?URINALYSIS, ROUTINE W REFLEX MICROSCOPIC  ?TROPONIN I (HIGH SENSITIVITY)  ?TROPONIN I (HIGH SENSITIVITY)  ? ? ?EKG ?None ? ?Radiology ?CT Angio Chest PE W and/or Wo Contrast ? ?Result Date:  08/28/2021 ?CLINICAL DATA:  Chest pain. Shortness of breath. Pulmonary embolism is suspected. EXAM: CT ANGIOGRAPHY CHEST WITH CONTRAST TECHNIQUE: Multidetector CT imaging of the chest was performed using the standard protocol during bolus administration of intravenous contrast. Multiplanar CT image reconstructions and MIPs were obtained to evaluate the vascular anatomy. RADIATION DOSE REDUCTION: This exam was performed according to the departmental dose-optimization program which includes automated exposure control, adjustment of the mA and/or kV according to patient size and/or use of iterative reconstruction technique. CONTRAST:  42m OMNIPAQUE IOHEXOL 350 MG/ML SOLN COMPARISON:  Chest radiograph 08/28/2021 FINDINGS: Cardiovascular: Negative for pulmonary embolism. Normal caliber of the thoracic aorta. Heart size is normal. No significant pericardial effusion. Mediastinum/Nodes: Enlarged subcarinal tissue measuring 1.9 cm in the short axis. There is abnormal soft tissue in the left hilum which appears to be causing endobronchial occlusion at the origin of the left lower lobe bronchus. No significant supraclavicular lymphadenopathy. No axillary lymph node enlargement. No significant right hilar lymphadenopathy. Lungs/Pleura: Trachea is patent. Evidence for a lesion causing occlusion of the left lower lobe bronchus near the origin. There also appears to be narrowing of the left upper lobe bronchus. Collapse and consolidation of the left lower lobe. There is an additional volume loss in the left upper lobe. Right lung is clear. No pleural effusions. Upper Abdomen: Normal appearance of the adrenal glands. Images of the upper abdomen are unremarkable. There is no significant lymph node enlargement in the upper abdomen. Musculoskeletal: No suspicious osseous lesions. Review of the MIP images confirms the above findings. IMPRESSION: 1. Collapse and consolidation throughout the left lower lobe appears to be related to an  endobronchial lesion and occlusion. Enlarged mediastinal lymph nodes and concern for abnormal soft tissue in the left hilum. Findings raise concern for a neoplastic process. Recommend pulmonary consultation. 2. Negative for pulmonary embolism. These results were called by telephone at the time of interpretation on 08/28/2021 at 5:23 pm [facility] verbally acknowledged these results. Electronically Signed   By: AMarkus DaftM.D.   On: 08/28/2021 17:27  ? ?DG Chest Port 1 View ? ?Result Date: 08/28/2021 ?CLINICAL DATA:  Questionable sepsis.  Evaluate for abnormality. EXAM: PORTABLE CHEST 1 VIEW COMPARISON:  None. FINDINGS: Cardiac silhouette and mediastinal contours are within normal limits. There is fairly homogeneous opacification of the inferior 40% of the left lung. The right lung is clear. No pneumothorax is seen. Mild dextrocurvature of the mid to upper thoracic spine. IMPRESSION: Fairly homogeneous opacification of the inferior 40% of the left lung may represent a combination of pleural fluid, atelectasis, and/or infection. Electronically  Signed   By: Yvonne Kendall M.D.   On: 08/28/2021 15:29   ? ?Procedures ?[facility] ?Performed by: Rhae Hammock, PA-C ?Authorized by: Rhae Hammock, PA-C  ? ?Critical care provider statement:  ?  Critical care time (minutes):  90 ?  Critical care was necessary to treat or prevent imminent or life-threatening deterioration of the following conditions:  Sepsis and respiratory failure ?  Critical care was time spent personally by me on the following activities:  Development of treatment plan with patient or surrogate, discussions with consultants, evaluation of patient's response to treatment, examination of patient, obtaining history from patient or surrogate, ordering and review of laboratory studies, ordering and performing treatments and interventions, pulse oximetry and re-evaluation of patient's condition ?  I assumed direction of critical  care for this patient from another provider in my specialty: no   ?  Care discussed with: admitting provider    ? ?Normal sinus rhythm, tachycardic ? ?Medications Ordered in ED ?Medications  ?azithromycin (ZITHROMAX) 500

## 2021-08-28 NOTE — Sepsis Progress Note (Signed)
ELink tracking the code sepsis.  

## 2021-08-28 NOTE — ED Notes (Signed)
MD notified of pt HR - pt tachy in the upper 120s-130s - pt is in bed at this time - reporting no pain and no SOB ?Introduced myself to pt at this time  ?

## 2021-08-28 NOTE — ED Triage Notes (Signed)
Pt arrived POV from urgent care to be ruled out for PE. Pt endorses some CP. Pt's HR is in the 150's. Per pt urgent care was concerned with pt's chest x-ray.  ?

## 2021-08-28 NOTE — Consult Note (Signed)
? ?NAME:  Heidi Gomez, MRN:  [id number], DOB:  1991-04-01, LOS: 0 ?ADMISSION DATE:  08/28/2021, CONSULTATION DATE: 08/28/2021 ?REFERRING MD: Dr. Posey Pronto, CHIEF COMPLAINT: Lung mass ? ?History of Present Illness:  ?This is a 31 year old female, no real significant past medical history.  Works from home.  Has no real significant exposures.  Non-smoker.  She had cough shortness of breath and a recent episode of back pain.  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  She also has associated mediastinal adenopathy concerning for malignancy.  Pulmonary was consulted for recommendations regarding bronchoscopy. ? ?Pertinent  Medical History  ?History reviewed. No pertinent past medical history.  ? ?Significant Hospital Events: ?Including procedures, antibiotic start and stop dates in addition to other pertinent events   ? ? ?Interim History / Subjective:  ?This is a 31 year old female with a left lower lobe mass. ? ?Objective   ?Blood pressure 134/84, pulse (!) 132, temperature (!) 100.6 ?F (38.1 ?C), temperature source Oral, resp. rate 19, height '5\' 2"'$  (1.575 m), weight 83.9 kg, SpO2 92 %. ?   ?   ? ?Intake/Output Summary (Last 24 hours) at 08/28/2021 1820 ?Last data filed at 08/28/2021 1812 ?Gross per 24 hour  ?Intake 3350 ml  ?Output --  ?Net 3350 ml  ? ?Filed Weights  ? 08/28/21 1426  ?Weight: 83.9 kg  ? ? ?Examination: ?General: Young female resting in bed. ?HENT: NCAT, tracking appropriately ?Lungs: Clear to auscultation bilaterally, diminished in the left lower lobe ?Cardiovascular: Regular rate rhythm, S1-S2 ?Abdomen: Soft, nontender nondistended ?Extremities: No significant edema ?Neuro: Alert oriented following commands ?GU: Deferred ? ?Resolved Hospital Problem list   ? ? 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 ? ?Plan: ?N.p.o. midnight ?Bronchoscopy scheduled for tomorrow. ?Likely include endobronchial cryobiopsies and tumor debulking for relief of left lower lobe obstruction. ?Also will need video bronchoscopy with endobronchial ultrasound and staging of the station 7 mediastinal adenopathy. ?I have placed a case request to be [place]. ?Hopefully will be able to schedule for tomorrow. ? ? ?Labs   ?CBC: ?Recent Labs  ?Lab 08/28/21 ?1435  ?WBC 25.0*  ?NEUTROABS 21.8*  ?HGB 13.1  ?HCT 40.1  ?MCV 88.5  ?PLT 502*  ? ? ?Basic Metabolic Panel: ?Recent Labs  ?Lab 08/28/21 ?1435  ?NA 136  ?K 3.5  ?CL 97*  ?CO2 24  ?GLUCOSE 118*  ?BUN <5*  ?CREATININE 0.67  ?CALCIUM 9.0  ? ?GFR: ?Estimated Creatinine Clearance: 103.2 mL/min (by C-G formula based on SCr of 0.67 mg/dL). ?Recent Labs  ?Lab 08/28/21 ?1435 08/28/21 ?1553  ?WBC 25.0*  --   ?LATICACIDVEN  --  1.4  ? ? ?Liver Function Tests: ?Recent Labs  ?Lab 08/28/21 ?1553  ?AST 19  ?ALT 18  ?ALKPHOS 104  ?BILITOT 0.6  ?PROT 7.3  ?ALBUMIN 2.9*  ? ?No results for input(s): LIPASE, AMYLASE in the last 168 hours. ?No results for input(s): AMMONIA in the last 168 hours. ? ?ABG ?No results found for: PHART, PCO2ART, PO2ART, HCO3, TCO2, ACIDBASEDEF, O2SAT  ? ?Coagulation Profile: ?Recent Labs  ?Lab 08/28/21 ?1553  ?INR 1.5*  ? ? ?Cardiac Enzymes: ?No results for input(s): CKTOTAL, CKMB, CKMBINDEX, TROPONINI in the last 168 hours. ? ?HbA1C: ?No results found for: HGBA1C ? ?CBG: ?No results for input(s): GLUCAP in the last  168 hours. ? ?Review of Systems:   ?Review of Systems  ?Constitutional:  Negative for chills, fever, malaise/fatigue and weight loss.  ?HENT:  Negative for hearing loss, sore throat and tinnitus.   ?Eyes:  Negative for blurred vision and double vision.  ?Respiratory:  Positive for cough and shortness of breath. Negative for hemoptysis, sputum production, wheezing and stridor.   ?Cardiovascular:  Negative for chest pain,  palpitations, orthopnea, leg swelling and PND.  ?Gastrointestinal:  Negative for abdominal pain, constipation, diarrhea, heartburn, nausea and vomiting.  ?Genitourinary:  Negative for dysuria, hematuria and urgency.  ?Musculoskeletal:  Negative for joint pain and myalgias.  ?Skin:  Negative for itching and rash.  ?Neurological:  Negative for dizziness, tingling, weakness and headaches.  ?Endo/Heme/Allergies:  Negative for environmental allergies. Does not bruise/bleed easily.  ?Psychiatric/Behavioral:  Negative for depression. The patient is not nervous/anxious and does not have insomnia.   ?All other systems reviewed and are negative.  ? ?Past Medical History:  ?She,  has no past medical history on file.  ? ?Surgical History:  ?History reviewed. No pertinent surgical history.  ? ?Social History:  ?   ? ?Family History:  ?Her family history is not on file.  ? ?Allergies ?Allergies  ?Allergen Reactions  ? Sulfa Antibiotics Hives  ?  ? ?Home Medications  ?Prior to Admission medications   ?Not on File  ? ? ? ? ?Garner Nash, DO ?[facility] ?08/28/2021 6:20 PM   ?

## 2021-08-28 NOTE — Hospital Course (Signed)
Heidi Gomez is a 31 y.o. female with no known significant medical history who is admitted with endobronchial mass associated with left lower lobe collapse and consolidation concerning for neoplastic process with possible postobstructive pneumonia.  Pulmonology to consult. ?

## 2021-08-29 ENCOUNTER — Encounter (HOSPITAL_COMMUNITY): Payer: Self-pay | Admitting: Internal Medicine

## 2021-08-29 ENCOUNTER — Ambulatory Visit (HOSPITAL_COMMUNITY): Admit: 2021-08-29 | Payer: Self-pay | Admitting: Pulmonary Disease

## 2021-08-29 ENCOUNTER — Inpatient Hospital Stay (HOSPITAL_COMMUNITY): Payer: Self-pay | Admitting: Anesthesiology

## 2021-08-29 ENCOUNTER — Encounter (HOSPITAL_COMMUNITY)
Admission: EM | Disposition: A | Discharge status: Home / Self Care | Payer: Self-pay | Source: Home / Self Care | Attending: Internal Medicine

## 2021-08-29 DIAGNOSIS — R911 Solitary pulmonary nodule: Secondary | ICD-10-CM

## 2021-08-29 HISTORY — PX: FINE NEEDLE ASPIRATION BIOPSY: CATH118315

## 2021-08-29 HISTORY — PX: VIDEO BRONCHOSCOPY WITH ENDOBRONCHIAL ULTRASOUND: SHX6177

## 2021-08-29 HISTORY — PX: VIDEO BRONCHOSCOPY: SHX5072

## 2021-08-29 HISTORY — PX: CRYOTHERAPY: SHX6894

## 2021-08-29 LAB — BASIC METABOLIC PANEL
Anion gap: 10 (ref 5–15)
BUN: <5 mg/dL — ABNORMAL LOW (ref 6–20)
CO2: 24 mmol/L (ref 22–32)
Calcium: 8.2 mg/dL — ABNORMAL LOW (ref 8.9–10.3)
Chloride: 103 mmol/L (ref 98–111)
Creatinine, Ser: 0.59 mg/dL (ref 0.44–1.00)
GFR, Estimated: >60 mL/min (ref >60)
Glucose, Bld: 105 mg/dL — ABNORMAL HIGH (ref 70–99)
Potassium: 3.4 mmol/L — ABNORMAL LOW (ref 3.5–5.1)
Sodium: 137 mmol/L (ref 135–145)

## 2021-08-29 LAB — CBC
HCT: 33.2 % — ABNORMAL LOW (ref 36.0–46.0)
Hemoglobin: 11 g/dL — ABNORMAL LOW (ref 12.0–15.0)
MCH: 28.7 pg (ref 26.0–34.0)
MCHC: 33.1 g/dL (ref 30.0–36.0)
MCV: 86.7 fL (ref 80.0–100.0)
Platelets: 423 10*3/uL — ABNORMAL HIGH (ref 150–400)
RBC: 3.83 MIL/uL — ABNORMAL LOW (ref 3.87–5.11)
RDW: 12.5 % (ref 11.5–15.5)
WBC: 20.6 10*3/uL — ABNORMAL HIGH (ref 4.0–10.5)
nRBC: 0 % (ref 0.0–0.2)

## 2021-08-29 LAB — HIV ANTIBODY (ROUTINE TESTING W REFLEX): HIV Screen 4th Generation wRfx: NONREACTIVE

## 2021-08-29 SURGERY — BRONCHOSCOPY, WITH FLUOROSCOPY
Anesthesia: General

## 2021-08-29 MED ORDER — LIDOCAINE 2% (20 MG/ML) 5 ML SYRINGE
INTRAMUSCULAR | Status: DC | PRN
  Administered 2021-08-29: 60 mg via INTRAVENOUS

## 2021-08-29 MED ORDER — POTASSIUM CHLORIDE CRYS ER 20 MEQ PO TBCR
40.0000 meq | EXTENDED_RELEASE_TABLET | Freq: Once | ORAL | Status: AC
  Administered 2021-08-29: 40 meq via ORAL
  Filled 2021-08-29: qty 2

## 2021-08-29 MED ORDER — SUGAMMADEX SODIUM 200 MG/2ML IV SOLN
INTRAVENOUS | Status: DC | PRN
  Administered 2021-08-29: 200 mg via INTRAVENOUS

## 2021-08-29 MED ORDER — FENTANYL CITRATE (PF) 100 MCG/2ML IJ SOLN
INTRAMUSCULAR | Status: AC
  Filled 2021-08-29: qty 2

## 2021-08-29 MED ORDER — ONDANSETRON HCL 4 MG/2ML IJ SOLN
INTRAMUSCULAR | Status: DC | PRN
  Administered 2021-08-29: 4 mg via INTRAVENOUS

## 2021-08-29 MED ORDER — MIDAZOLAM HCL 2 MG/2ML IJ SOLN
INTRAMUSCULAR | Status: DC | PRN
  Administered 2021-08-29: 2 mg via INTRAVENOUS

## 2021-08-29 MED ORDER — FENTANYL CITRATE (PF) 250 MCG/5ML IJ SOLN
INTRAMUSCULAR | Status: DC | PRN
  Administered 2021-08-29: 100 ug via INTRAVENOUS

## 2021-08-29 MED ORDER — ROCURONIUM BROMIDE 10 MG/ML (PF) SYRINGE
PREFILLED_SYRINGE | INTRAVENOUS | Status: DC | PRN
  Administered 2021-08-29: 70 mg via INTRAVENOUS
  Administered 2021-08-29: 20 mg via INTRAVENOUS

## 2021-08-29 MED ORDER — PROPOFOL 10 MG/ML IV BOLUS
INTRAVENOUS | Status: DC | PRN
  Administered 2021-08-29: 160 mg via INTRAVENOUS

## 2021-08-29 MED ORDER — LACTATED RINGERS IV SOLN: INTRAVENOUS | Status: DC | PRN

## 2021-08-29 MED ORDER — AMISULPRIDE (ANTIEMETIC) 5 MG/2ML IV SOLN
10.0000 mg | Freq: Once | INTRAVENOUS | Status: DC | PRN
  Filled 2021-08-29: qty 4

## 2021-08-29 MED ORDER — ALPRAZOLAM 0.5 MG PO TABS
0.5000 mg | ORAL_TABLET | Freq: Once | ORAL | Status: AC
  Administered 2021-08-29: 0.5 mg via ORAL
  Filled 2021-08-29: qty 1

## 2021-08-29 MED ORDER — FENTANYL CITRATE (PF) 100 MCG/2ML IJ SOLN: 25.0000 ug | INTRAMUSCULAR | Status: DC | PRN

## 2021-08-29 MED ORDER — MIDAZOLAM HCL 2 MG/2ML IJ SOLN
INTRAMUSCULAR | Status: AC
  Filled 2021-08-29: qty 2

## 2021-08-29 MED ORDER — EPINEPHRINE PF 1 MG/ML IJ SOLN
INTRAMUSCULAR | Status: DC | PRN
  Administered 2021-08-29 (×2): 1 mL via ENDOTRACHEOPULMONARY
  Administered 2021-08-29: 1 mg via ENDOTRACHEOPULMONARY

## 2021-08-29 MED ORDER — DEXAMETHASONE SODIUM PHOSPHATE 10 MG/ML IJ SOLN
INTRAMUSCULAR | Status: DC | PRN
Start: 2021-08-29 — End: 2021-08-29
  Administered 2021-08-29: 10 mg via INTRAVENOUS

## 2021-08-29 SURGICAL SUPPLY — 30 items
BRUSH CYTOL CELLEBRITY 1.5X140 (MISCELLANEOUS) IMPLANT
CANISTER SUCT 3000ML PPV (MISCELLANEOUS) ×4 IMPLANT
CONT SPEC 4OZ CLIKSEAL STRL BL (MISCELLANEOUS) ×4 IMPLANT
COVER BACK TABLE 60X90IN (DRAPES) ×4 IMPLANT
COVER DOME SNAP 22 D (MISCELLANEOUS) ×4 IMPLANT
FORCEPS BIOP RJ4 1.8 (CUTTING FORCEPS) IMPLANT
GAUZE SPONGE 4X4 12PLY STRL (GAUZE/BANDAGES/DRESSINGS) ×4 IMPLANT
GLOVE BIO SURGEON STRL SZ7.5 (GLOVE) ×4 IMPLANT
GOWN STRL REUS W/ TWL LRG LVL3 (GOWN DISPOSABLE) ×3 IMPLANT
GOWN STRL REUS W/TWL LRG LVL3 (GOWN DISPOSABLE) ×4
KIT CLEAN ENDO COMPLIANCE (KITS) ×8 IMPLANT
KIT TURNOVER KIT B (KITS) ×4 IMPLANT
MARKER SKIN DUAL TIP RULER LAB (MISCELLANEOUS) ×4 IMPLANT
NDL EBUS SONO TIP PENTAX (NEEDLE) ×3 IMPLANT
NEEDLE EBUS SONO TIP PENTAX (NEEDLE) ×4 IMPLANT
NS IRRIG 1000ML POUR BTL (IV SOLUTION) ×4 IMPLANT
OIL SILICONE PENTAX (PARTS (SERVICE/REPAIRS)) ×4 IMPLANT
PAD ARMBOARD 7.5X6 YLW CONV (MISCELLANEOUS) ×8 IMPLANT
SOL ANTI FOG 6CC (MISCELLANEOUS) ×3 IMPLANT
SOLUTION ANTI FOG 6CC (MISCELLANEOUS) ×1
SYR 20CC LL (SYRINGE) ×8 IMPLANT
SYR 20ML ECCENTRIC (SYRINGE) ×8 IMPLANT
SYR 50ML SLIP (SYRINGE) IMPLANT
SYR 5ML LUER SLIP (SYRINGE) ×4 IMPLANT
TOWEL OR 17X24 6PK STRL BLUE (TOWEL DISPOSABLE) ×4 IMPLANT
TRAP SPECIMEN MUCOUS 40CC (MISCELLANEOUS) IMPLANT
TUBE CONNECTING 20X1/4 (TUBING) ×8 IMPLANT
UNDERPAD 30X30 (UNDERPADS AND DIAPERS) ×4 IMPLANT
VALVE DISPOSABLE (MISCELLANEOUS) ×4 IMPLANT
WATER STERILE IRR 1000ML POUR (IV SOLUTION) ×4 IMPLANT

## 2021-08-29 NOTE — Plan of Care (Signed)

## 2021-08-29 NOTE — Progress Notes (Signed)
PCCM: ? ?Bronchoscopy scheduled at 1115AM [facility]  ? ?Garner Nash, DO ?[facility] ?08/29/2021 9:09 AM   ?

## 2021-08-29 NOTE — Anesthesia Preprocedure Evaluation (Addendum)
Anesthesia Evaluation  ?Patient identified by MRN, date of birth, ID band ?Patient awake ? ? ? ?Airway ?Mallampati: I ? ?TM Distance: >3 FB ?Neck ROM: Full ? ? ? Dental ?no notable dental hx. ? ?  ?Pulmonary ? ?Lung mass ?  ? ?+ decreased breath sounds ? ? ? ? ? Cardiovascular ?negative cardio ROS ? ? ?Rhythm:Regular Rate:Normal ? ? ?  ?Neuro/Psych ?negative neurological ROS ? negative psych ROS  ? GI/Hepatic ?negative GI ROS, Neg liver ROS,   ?Endo/Other  ?negative endocrine ROS ? Renal/GU ?negative Renal ROS  ?negative genitourinary ?  ?Musculoskeletal ?negative musculoskeletal ROS ?(+)  ? Abdominal ?Normal abdominal exam  (+)   ?Peds ? Hematology ?negative hematology ROS ?(+)   ?Anesthesia Other Findings ? ? Reproductive/Obstetrics ? ?  ? ? ? ? ? ? ? ? ? ? ? ? ? ?  ?  ? ? ? ? ? ? ? ?Anesthesia Physical ?Anesthesia Plan ? ?ASA: 1 ? ?Anesthesia Plan: General  ? ?Post-op Pain Management:   ? ?Induction: Intravenous ? ?PONV Risk Score and Plan: 3 and Ondansetron, Dexamethasone, Midazolam and Treatment may vary due to age or medical condition ? ?Airway Management Planned: Mask and Oral ETT ? ?Additional Equipment: None ? ?Intra-op Plan:  ? ?Post-operative Plan: Extubation in OR ? ?Informed Consent: I have reviewed the patients History and Physical, chart, labs and discussed the procedure including the risks, benefits and alternatives for the proposed anesthesia with the patient or authorized representative who has indicated his/her understanding and acceptance.  ? ? ? ?Dental advisory given ? ?Plan Discussed with: CRNA ? ?Anesthesia Plan Comments:   ? ? ? ? ? ? ?Anesthesia Quick Evaluation ? ?

## 2021-08-29 NOTE — Anesthesia Procedure Notes (Signed)
Procedure Name: Intubation ?Date/Time: 08/29/2021 11:43 AM ?Performed by: Thelma Comp, CRNA ?Pre-anesthesia Checklist: Patient identified, Emergency Drugs available, Suction available and Patient being monitored ?Patient Re-evaluated:Patient Re-evaluated prior to induction ?Oxygen Delivery Method: Circle System Utilized ?Preoxygenation: Pre-oxygenation with 100% oxygen ?Induction Type: IV induction ?Ventilation: Mask ventilation without difficulty ?Laryngoscope Size: Mac and 3 ?Grade View: Grade I ?Tube type: Oral ?Tube size: 8.5 mm ?Number of attempts: 1 ?Airway Equipment and Method: Stylet ?Placement Confirmation: ETT inserted through vocal cords under direct vision, positive ETCO2 and breath sounds checked- equal and bilateral ?Secured at: 22 cm ?Tube secured with: Tape ?Dental Injury: Teeth and Oropharynx as per pre-operative assessment  ? ? ? ? ?

## 2021-08-29 NOTE — Discharge Instructions (Signed)
Flexible Bronchoscopy, Care After ?This sheet gives you information about how to care for yourself after your test. Your doctor may also give you more specific instructions. If you have problems or questions, contact your doctor. ?Follow these instructions at home: ?Eating and drinking ?Do not eat or drink anything (not even water) for 2 hours after your test, or until your numbing medicine (local anesthetic) wears off. ?When your numbness is gone and your cough and gag reflexes have come back, you may: ?Eat only soft foods. ?Slowly drink liquids. ?The day after the test, go back to your normal diet. ?Driving ?Do not drive for 24 hours if you were given a medicine to help you relax (sedative). ?Do not drive or use heavy machinery while taking prescription pain medicine. ?General instructions ? ?Take over-the-counter and prescription medicines only as told by your doctor. ?Return to your normal activities as told. Ask what activities are safe for you. ?Do not use any products that have nicotine or tobacco in them. This includes cigarettes and e-cigarettes. If you need help quitting, ask your doctor. ?Keep all follow-up visits as told by your doctor. This is important. It is very important if you had a tissue sample (biopsy) taken. ?Get help right away if: ?You have shortness of breath that gets worse. ?You get light-headed. ?You feel like you are going to pass out (faint). ?You have chest pain. ?You cough up: ?More than a little blood. ?More blood than before. ?Summary ?Do not eat or drink anything (not even water) for 2 hours after your test, or until your numbing medicine wears off. ?Do not use cigarettes. Do not use e-cigarettes. ?Get help right away if you have chest pain. ? ?This information is not intended to replace advice given to you by your health care provider. Make sure you discuss any questions you have with your health care provider. ?Document Released: 03/23/2009 Document Revised: 05/08/2017 Document  Reviewed: 06/13/2016 ?Elsevier Patient Education ? [employer]. ? ?

## 2021-08-29 NOTE — Progress Notes (Signed)
?PROGRESS NOTE ? ? ? ?Heidi Gomez  ZOX:[id number] DOB: July 19, 1990 DOA: 08/28/2021 ?PCP: Pcp, No  ?Outpatient Specialists:  ? ?Brief Narrative:  ?Patient is a 31 year old Caucasian female with past medical history significant for recurrent COVID infection (about 4 times).  Patient was admitted with shortness of breath, productive cough, pain to left lower back with deep inspiration, chills, diaphoresis, fatigue and nausea.  Chest x-ray done at an urgent care facility revealed moderate left pleural effusion with left lower lobe airspace opacity.  Patient also remained tachycardic, with heart rate ranging to the 130s.  CT of the chest done revealed: "1. Collapse and consolidation throughout the left lower lobe appears ?to be related to an endobronchial lesion and occlusion. Enlarged ?mediastinal lymph nodes and concern for abnormal soft tissue in the ?left hilum. Findings raise concern for a neoplastic process. ?Recommend pulmonary consultation. ?2. Negative for pulmonary embolism". ? ?Patient underwent bronchoscopy with biopsy earlier today.  Patient remains significantly tachycardic, anxious and not keen on being discharged back on. ? ? ?Assessment & Plan: ?  ?Principal Problem: ?  Endobronchial mass: ?-See above documentation. ?-Patient has undergone bronchoscopy (see pulmonary documentation). ?-Patient will follow with pulmonary team on discharge. ? ?Postoperative obstructive pneumonia: ?-Continue IV Rocephin and ceftriaxone. ?-Follow pending cultures. ?-Leukocytosis is slowly resolving. ?-Patient remains significantly tachycardic. ? ?Sinus tachycardia: ?-Likely secondary to above. ?-Continue to manage primary process. ?-Have a low threshold to try low-dose beta-blocker. ? ?Mild hypokalemia: ?-Continue to monitor and replete. ? ?Mild obesity: ?-Diet and exercise. ?-Further management by the primary care provider on discharge. ? ?Elevated blood pressure: ?-No previously diagnosed hypertensive. ?-Continue to monitor  for now. ?-Monitor blood pressure at home, document readings and review with provider. ? ? ?DVT prophylaxis: SCD ?Code Status: Full code ?Family Communication:  ?Disposition Plan: Home. ? ? ?Consultants:  ?Pulmonary team ? ?Procedures:  ?Bronchoscopy and biopsy of endobronchial mass ? ?Antimicrobials:  ?IV ceftriaxone ?IV Rocephin ? ? ?Subjective: ?No new complaints. ?No shortness of breath. ?No chest pain. ?No fever. ? ?Objective: ?Vitals:  ? 08/29/21 1436 08/29/21 1451 08/29/21 1506 08/29/21 1537  ?BP: (!) 133/97 (!) 136/98 (!) 142/95   ?Pulse: (!) 127 (!) 123 (!) 122   ?Resp: (!) 24 (!) 30 (!) 22   ?Temp:   (!) 97.3 ?F (36.3 ?C) 99.2 ?F (37.3 ?C)  ?TempSrc:    Oral  ?SpO2: 94% 93% 92%   ?Weight:      ?Height:      ? ? ?Intake/Output Summary (Last 24 hours) at 08/29/2021 1740 ?Last data filed at 08/29/2021 1403 ?Gross per 24 hour  ?Intake 1800 ml  ?Output 150 ml  ?Net 1650 ml  ? ?Filed Weights  ? 08/28/21 1426 08/29/21 1100  ?Weight: 83.9 kg 83.9 kg  ? ? ?Examination: ?General exam: Appears calm and comfortable.  Mildly anxious. ?Respiratory system: Clear to auscultation.  ?Cardiovascular system: S1 & S2, tachycardic.   ?Gastrointestinal system: Abdomen is obese, soft and nontender.  Organs are difficult to assess.   ?Central nervous system: Alert and oriented. No focal neurological deficits. ?Extremities: No lower extremity edema. ? ?Data Reviewed: I have personally reviewed following labs and imaging studies ? ?CBC: ?Recent Labs  ?Lab 08/28/21 ?1435 08/29/21 ?0353  ?WBC 25.0* 20.6*  ?NEUTROABS 21.8*  --   ?HGB 13.1 11.0*  ?HCT 40.1 33.2*  ?MCV 88.5 86.7  ?PLT 502* 423*  ? ?Basic Metabolic Panel: ?Recent Labs  ?Lab 08/28/21 ?1435 08/29/21 ?0353  ?NA 136 137  ?K 3.5 3.4*  ?  CL 97* 103  ?CO2 24 24  ?GLUCOSE 118* 105*  ?BUN <5* <5*  ?CREATININE 0.67 0.59  ?CALCIUM 9.0 8.2*  ? ?GFR: ?Estimated Creatinine Clearance: 103.2 mL/min (by C-G formula based on SCr of 0.59 mg/dL). ?Liver Function Tests: ?Recent Labs  ?Lab  08/28/21 ?1553  ?AST 19  ?ALT 18  ?ALKPHOS 104  ?BILITOT 0.6  ?PROT 7.3  ?ALBUMIN 2.9*  ? ?No results for input(s): LIPASE, AMYLASE in the last 168 hours. ?No results for input(s): AMMONIA in the last 168 hours. ?Coagulation Profile: ?Recent Labs  ?Lab 08/28/21 ?1553  ?INR 1.5*  ? ?Cardiac Enzymes: ?No results for input(s): CKTOTAL, CKMB, CKMBINDEX, TROPONINI in the last 168 hours. ?BNP (last 3 results) ?No results for input(s): PROBNP in the last 8760 hours. ?HbA1C: ?No results for input(s): HGBA1C in the last 72 hours. ?CBG: ?No results for input(s): GLUCAP in the last 168 hours. ?Lipid Profile: ?No results for input(s): CHOL, HDL, LDLCALC, TRIG, CHOLHDL, LDLDIRECT in the last 72 hours. ?Thyroid Function Tests: ?No results for input(s): TSH, T4TOTAL, FREET4, T3FREE, THYROIDAB in the last 72 hours. ?Anemia Panel: ?No results for input(s): VITAMINB12, FOLATE, FERRITIN, TIBC, IRON, RETICCTPCT in the last 72 hours. ?Urine analysis: ?   ?Component Value Date/Time  ? [place] YELLOW 08/28/2021 1959  ? APPEARANCEUR CLEAR 08/28/2021 1959  ? LABSPEC 1.021 08/28/2021 1959  ? PHURINE 7.0 08/28/2021 1959  ? [place] NEGATIVE 08/28/2021 1959  ? HGBUR SMALL (A) 08/28/2021 1959  ? [place] NEGATIVE 08/28/2021 1959  ? KETONESUR 20 (A) 08/28/2021 1959  ? [place] NEGATIVE 08/28/2021 1959  ? UROBILINOGEN 0.2 01/23/2012 0535  ? NITRITE NEGATIVE 08/28/2021 1959  ? LEUKOCYTESUR NEGATIVE 08/28/2021 1959  ? ?Sepsis Labs: ?'@LABRCNTIP'$ (procalcitonin:4,lacticidven:4) ? ?) ?Recent Results (from the past 240 hour(s))  ?Resp Panel by RT-PCR (Flu A&B, Covid)     Status: None  ? Collection Time: 08/28/21  1:39 PM  ? Specimen: Nasopharyngeal(NP) swabs in vial transport medium  ?Result Value Ref Range Status  ? SARS Coronavirus 2 by RT PCR NEGATIVE NEGATIVE Final  ?  Comment: (NOTE) ?SARS-CoV-2 target nucleic acids are NOT DETECTED. ? ?The SARS-CoV-2 RNA is generally detectable in upper respiratory ?specimens during the acute phase of  infection. The lowest ?concentration of SARS-CoV-2 viral copies this assay can detect is ?138 copies/mL. A negative result does not preclude SARS-Cov-2 ?infection and should not be used as the sole basis for treatment or ?other patient management decisions. A negative result may occur with  ?improper specimen collection/handling, submission of specimen other ?than nasopharyngeal swab, presence of viral mutation(s) within the ?areas targeted by this assay, and inadequate number of viral ?copies(<138 copies/mL). A negative result must be combined with ?clinical observations, patient history, and epidemiological ?information. The expected result is Negative. ? ?Fact Sheet for Patients:  ?EntrepreneurPulse.com.au ? ?Fact Sheet for Healthcare Providers:  ?IncredibleEmployment.be ? ?This test is no t yet approved or cleared by the [place] FDA and  ?has been authorized for detection and/or diagnosis of SARS-CoV-2 by ?FDA under an Emergency Use Authorization (EUA). This EUA will remain  ?in effect (meaning this test can be used) for the duration of the ?COVID-19 declaration under Section 564(b)(1) of the Act, 21 ?U.S.C.section 360bbb-3(b)(1), unless the authorization is terminated  ?or revoked sooner.  ? ? ?  ? Influenza A by PCR NEGATIVE NEGATIVE Final  ? Influenza B by PCR NEGATIVE NEGATIVE Final  ?  Comment: (NOTE) ?The Xpert Xpress SARS-CoV-2/FLU/RSV plus assay is intended as an aid ?in the diagnosis  of influenza from Nasopharyngeal swab specimens and ?should not be used as a sole basis for treatment. Nasal washings and ?aspirates are unacceptable for Xpert Xpress SARS-CoV-2/FLU/RSV ?testing. ? ?Fact Sheet for Patients: ?EntrepreneurPulse.com.au ? ?Fact Sheet for Healthcare Providers: ?IncredibleEmployment.be ? ?This test is not yet approved or cleared by the [place] FDA and ?has been authorized for detection and/or diagnosis of SARS-CoV-2  by ?FDA under an Emergency Use Authorization (EUA). This EUA will remain ?in effect (meaning this test can be used) for the duration of the ?COVID-19 declaration under Section 564(b)(1) of the Act, 21 U.S.C. ?se

## 2021-08-29 NOTE — Op Note (Signed)
Video Bronchoscopy with Endobronchial Ultrasound Procedure Note ?Video bronchoscopy with cryotherapy, tumor debulking, lesional excision ? ?Date of Operation: 08/29/2021 ? ?Pre-op Diagnosis: Left endobronchial tumor, adenopathy  ? ?Post-op Diagnosis: Left endobronchial tumor, adenopathy ? ?Surgeon: Garner Nash, DO ? ?Assistants: None  ? ?Anesthesia: General endotracheal anesthesia ? ?Operation: Flexible video fiberoptic bronchoscopy with endobronchial ultrasound and biopsies. ? ?Estimated Blood Loss: less than 100  ? ?Complications: None  ? ?Indications and History: ?Heidi Gomez is a 32 y.o. female with no significant medical problems, left endobronchial tumor with adenopathy on CT.  The risks, benefits, complications, treatment options and expected outcomes were discussed with the patient.  The possibilities of pneumothorax, pneumonia, reaction to medication, pulmonary aspiration, perforation of a viscus, bleeding, failure to diagnose a condition and creating a complication requiring transfusion or operation were discussed with the patient who freely signed the consent.   ? ?Description of Procedure: ?The patient was examined in the preoperative area and history and data from the preprocedure consultation were reviewed. It was deemed appropriate to proceed.  The patient was taken to [facility] endoscopy room 3, identified as Heidi Gomez and the procedure verified as Flexible Video Fiberoptic Bronchoscopy.  A Time Out was held and the above information confirmed. After being taken to the operating room general anesthesia was initiated and the patient  was orally intubated. The video fiberoptic bronchoscope was introduced via the endotracheal tube and a general inspection was performed which showed normal right lung anatomy, left upper lobe takeoff was visible, left lower lobe fully obstructed with large round tumor protruding from the left lower lobe opening and in encroaching on the left main carina.   ? ?Using standard therapeutic bronchoscope and the 2.4 mm cryotherapy probe endobronchial cryobiopsies, cryotherapy, tumor debulking and lesional excision was completed.  Freeze thaw cycles and en bloc retraction of tissue from the left lower lobe obstructing tumor was necessary.  We were able to take the tumor out of the left mainstem and remove.  We were able to take a majority of the tumor down to the base which appears to be coming from the inferior portion of the left main subcarina along the anterior wall.  We stopped there due to the amount of bleeding and clot formation.  Was becoming very difficult to see anything distally.  Due to the extrinsic compression of the tumor coming through that wall there is no more pedunculated base present. ? ?The standard scope was then withdrawn and the endobronchial ultrasound was used to identify and characterize the peritracheal, hilar and bronchial lymph nodes. Inspection showed enlarged subcarinal station 7 adenopathy. Using real-time ultrasound guidance Wang needle biopsies were take from Station 7 nodes and were sent for cytology. The patient tolerated the procedure well without apparent complications. There was no significant blood loss. The bronchoscope was withdrawn. Anesthesia was reversed and the patient was taken to the PACU for recovery.  ? ?Samples: ?1.  Endobronchial cryobiopsies from endobronchial left lower lobe tumor ?2. Wang needle biopsies from station 7 node ? ?Plans:  ?The patient will be discharged from the PACU to home when recovered from anesthesia. We will review the cytology, pathology results with the patient when they become available. Outpatient followup will be with Garner Nash, DO.  ? ? ?Garner Nash, DO ?[facility] ?08/29/2021 1:52 PM   ? ? ? ? ? ? ? ? ?

## 2021-08-29 NOTE — Transfer of Care (Signed)
Immediate Anesthesia Transfer of Care Note ? ?Patient: Heidi Gomez ? ?Procedure(s) Performed: VIDEO BRONCHOSCOPY WITH FLUORO (Left) ?VIDEO BRONCHOSCOPY WITH ENDOBRONCHIAL ULTRASOUND ?CRYOTHERAPY ?FINE NEEDLE ASPIRATION BIOPSY ? ?Patient Location: PACU ? ?Anesthesia Type:General ? ?Level of Consciousness: drowsy and patient cooperative ? ?Airway & Oxygen Therapy: Patient Spontanous Breathing and Patient connected to face mask oxygen ? ?Post-op Assessment: Report given to RN and Post -op Vital signs reviewed and stable ? ?Post vital signs: Reviewed and stable ? ?Last Vitals:  ?Vitals Value Taken Time  ?BP 125/79 08/29/21 1406  ?Temp    ?Pulse 122 08/29/21 1409  ?Resp 39 08/29/21 1409  ?SpO2 100 % 08/29/21 1409  ?Vitals shown include unvalidated device data. ? ?Last Pain:  ?Vitals:  ? 08/29/21 1100  ?TempSrc: Temporal  ?PainSc: 0-No pain  ?   ? ?  ? ?Complications: No notable events documented. ?

## 2021-08-29 NOTE — Interval H&P Note (Signed)
History and Physical Interval Note: ? ?08/29/2021 ?11:16 AM ? ?[name]  has presented today for surgery, with the diagnosis of lung mass.  The various methods of treatment have been discussed with the patient and family. After consideration of risks, benefits and other options for treatment, the patient has consented to  Procedure(s) with comments: ?VIDEO BRONCHOSCOPY WITH FLUORO (Left) - w/ cyrotherapy ?VIDEO BRONCHOSCOPY WITH ENDOBRONCHIAL ULTRASOUND (N/A) as a surgical intervention.  The patient's history has been reviewed, patient examined, no change in status, stable for surgery.  I have reviewed the patient's chart and labs.  Questions were answered to the patient's satisfaction.   ? ? ?Heidi Gomez ? ? ?

## 2021-08-30 LAB — RENAL FUNCTION PANEL
Albumin: 2.3 g/dL — ABNORMAL LOW (ref 3.5–5.0)
Anion gap: 7 (ref 5–15)
BUN: 6 mg/dL (ref 6–20)
CO2: 25 mmol/L (ref 22–32)
Calcium: 8.3 mg/dL — ABNORMAL LOW (ref 8.9–10.3)
Chloride: 106 mmol/L (ref 98–111)
Creatinine, Ser: 0.6 mg/dL (ref 0.44–1.00)
GFR, Estimated: >60 mL/min (ref >60)
Glucose, Bld: 121 mg/dL — ABNORMAL HIGH (ref 70–99)
Phosphorus: 2.6 mg/dL (ref 2.5–4.6)
Potassium: 4.7 mmol/L (ref 3.5–5.1)
Sodium: 138 mmol/L (ref 135–145)

## 2021-08-30 LAB — CBC WITH DIFFERENTIAL/PLATELET
Abs Immature Granulocytes: 0.12 10*3/uL — ABNORMAL HIGH (ref 0.00–0.07)
Basophils Absolute: 0 10*3/uL (ref 0.0–0.1)
Basophils Relative: 0 %
Eosinophils Absolute: 0 10*3/uL (ref 0.0–0.5)
Eosinophils Relative: 0 %
HCT: 33.6 % — ABNORMAL LOW (ref 36.0–46.0)
Hemoglobin: 11.2 g/dL — ABNORMAL LOW (ref 12.0–15.0)
Immature Granulocytes: 1 %
Lymphocytes Relative: 12 %
Lymphs Abs: 1.9 10*3/uL (ref 0.7–4.0)
MCH: 29.5 pg (ref 26.0–34.0)
MCHC: 33.3 g/dL (ref 30.0–36.0)
MCV: 88.4 fL (ref 80.0–100.0)
Monocytes Absolute: 1.2 10*3/uL — ABNORMAL HIGH (ref 0.1–1.0)
Monocytes Relative: 7 %
Neutro Abs: 13.6 10*3/uL — ABNORMAL HIGH (ref 1.7–7.7)
Neutrophils Relative %: 80 %
Platelets: 524 10*3/uL — ABNORMAL HIGH (ref 150–400)
RBC: 3.8 MIL/uL — ABNORMAL LOW (ref 3.87–5.11)
RDW: 12.4 % (ref 11.5–15.5)
WBC: 16.8 10*3/uL — ABNORMAL HIGH (ref 4.0–10.5)
nRBC: 0 % (ref 0.0–0.2)

## 2021-08-30 LAB — MAGNESIUM: Magnesium: 2.2 mg/dL (ref 1.7–2.4)

## 2021-08-30 MED ORDER — GUAIFENESIN ER 600 MG PO TB12: 600.0000 mg | ORAL_TABLET | Freq: Two times a day (BID) | ORAL | Status: DC | PRN

## 2021-08-30 MED ORDER — AMOXICILLIN-POT CLAVULANATE 875-125 MG PO TABS: 1.0000 | ORAL_TABLET | Freq: Two times a day (BID) | ORAL | Status: DC

## 2021-08-30 MED ORDER — ALPRAZOLAM 0.25 MG PO TABS: 0.2500 mg | ORAL_TABLET | Freq: Two times a day (BID) | ORAL | 0 refills | Status: AC | PRN

## 2021-08-30 MED ORDER — SACCHAROMYCES BOULARDII 250 MG PO CAPS
250.0000 mg | ORAL_CAPSULE | Freq: Two times a day (BID) | ORAL | 0 refills | Status: AC
Start: 2021-08-30 — End: 2021-09-09

## 2021-08-30 MED ORDER — AMOXICILLIN-POT CLAVULANATE 875-125 MG PO TABS: 1.0000 | ORAL_TABLET | Freq: Two times a day (BID) | ORAL | 0 refills | Status: AC

## 2021-08-30 NOTE — Discharge Summary (Signed)
? ?Discharge Summary ? ?DAZIYAH COGAN YHC:[id number] DOB: [birth year] ? ?PCP: Pcp, No ? ?Admit date: 08/28/2021 ?Discharge date: 08/30/2021 ? ? ? ?Recommendations for Outpatient Follow-up:  ?F/u with PCP within a week  for hospital discharge follow up, repeat cbc/bmp at follow up. Pcp to monitor ddimer, pcp to check TSH ?F/u with pulmonology Dr Valeta Harms. ? ? ? ?Discharge Diagnoses:  ?Active Hospital Problems  ? Diagnosis Date Noted  ? Endobronchial mass 08/28/2021  ?  Priority: 1.  ?  ?Resolved Hospital Problems  ?No resolved problems to display.  ? ? ?Discharge Condition: stable, improved  ? ?Diet recommendation: regular diet  ? ?Filed Weights  ? 08/28/21 1426 08/29/21 1100  ?Weight: 83.9 kg 83.9 kg  ? ? ?History of present illness: ( per admitting MD Dr Posey Pronto) ?Chief Complaint: Shortness of breath, cough ?  ?HPI: ?Heidi Gomez is a 31 y.o. female with no known significant medical history who presented to the ED for evaluation of shortness of breath. ? ?Patient reports 5-6 days of new onset shortness of breath with productive cough, pain to left lower back with deep inspiration, chills, diaphoresis, fatigue, and nausea.  She says that she has had COVID 4 times since the beginning of the pandemic with last time in February.  She went to urgent care today for further evaluation.  Chest x-ray was performed and showed moderate left pleural effusion with left lower lobe airspace opacity.  Patient was noted to have sinus tachycardia with rate 138 bpm by EKG.  There was concern for PE and she was sent to the ED for further evaluation. ? ?Patient denies any known chronic medical conditions.  She does not take any medications regularly other than migraine medication as needed.  She denies any history of tobacco, alcohol, illicit drug use.  She reports an allergy to sulfa which causes her to break out in hives.  She reports a history of bladder cancer in her grandfather. ?  ?ED Course  Labs/Imaging on admission: I have personally  reviewed following labs and imaging studies. ?  ?Initial vitals showed BP 151/94, pulse 153, RR 21, temp 100.6 ?F, SPO2 97% on room air. ?  ?Labs show WBC 25.0, hemoglobin 13.1, platelets 502,000, sodium 136, potassium 3.5, bicarb 24, BUN <5, creatinine 0.67, serum glucose 118, troponin 11, lactic acid 1.4, i-STAT beta-hCG 10.4. ?  ?SARS-CoV-2 and influenza PCR negative.  Blood cultures in process. ?  ?Portable chest x-ray showed homogenous opacification of inferior 40% of the left lung. ?  ?CTA chest PE study showed collapse and consolidation throughout the left lower lobe which appears to be related to an endobronchial lesion and inclusion.  Enlarged mediastinal lymph nodes and concern for abnormal soft tissue in the left hilum seen.  Findings concerning for neoplastic process.  Negative for PE. ?  ?Patient was given 3 L LR, IV ceftriaxone and azithromycin.  EDP consulted pulmonology who recommended medical admission and they will arrange for further evaluation of endobronchial mass.  The hospitalist service was consulted to admit for further evaluation and management. ?  ? ?Hospital Course:  ?Principal Problem: ?  Endobronchial mass ? ? ?Assessment and Plan: ? ?* Endobronchial mass with elevated ddimer ( greater than 5000 at urgent care per patient's reports) ?-CTA chest  negative for PE, it shows occluding lesion near the origin of the left lower lobe bronchus associated with collapse and consolidation in the left lower lobe.  Findings concerning for neoplastic process.  This was discussed with patient  and family at bedside.   ?-Given fever, leukocytosis, tachycardia  she received antibiotics rocephin/zithro on admission ?-she is Seen by pulm Dr Valeta Harms s/p Flexible video fiberoptic bronchoscopy with endobronchial ultrasound and biopsies on 3/23 ?Blood culture no growth ? -she is feeling much better, wbc trending down, tachycardia improved, she is on room air,  no fever. she desires to go home,  case discussed  with pulm Dr Erin Fulling who recommended discharge on augmentin for 10 days to cover possible postobstructive pneumonia, f/u with Dr Valeta Harms ? ? ?Sinus tachycardia ?Likely reactive/stress related ?Improved, ?F/u with pcp, pcp to check tsh ? ? ?Hypertension ?Likely from acute illness/stress ?Improved ?F/u with pcp ? ?Anxiety ?She desires prn xanax , mother also states it helped, xanax 0.25 bid prn prescribed , f/u with pcp ? ? ?Body mass index is 33.83 kg/m?Marland Kitchen Meet obesity criteria ?Diet and exercise  ?F/u with pcp ? ? ?Discharge Exam: ?BP 110/68 (BP Location: Right Arm)   Pulse 86   Temp (!) 97.5 ?F (36.4 ?C) (Oral)   Resp 15   Ht '5\' 2"'$  (1.575 m)   Wt 83.9 kg   SpO2 98%   BMI 33.83 kg/m?  ? ?General: NAD, pleasant  ?Cardiovascular: slight intermittent sinus tachycardia ?Respiratory: normal respiratory effort  ? ? ? ?Discharge Instructions   ? ? Diet general   Complete by: As directed ?  ? Increase activity slowly   Complete by: As directed ?  ? ?  ? ?Allergies as of 08/30/2021   ? ?   Reactions  ? Sulfa Antibiotics Hives  ? ?  ? ?  ?Medication List  ?  ? ?TAKE these medications   ? ?ALPRAZolam 0.25 MG tablet ?Commonly known as: Xanax ?Take 1 tablet (0.25 mg total) by mouth 2 (two) times daily as needed for anxiety. ?  ?amoxicillin-clavulanate 875-125 MG tablet ?Commonly known as: AUGMENTIN ?Take 1 tablet by mouth every 12 (twelve) hours for 10 days. ?  ?guaiFENesin 600 MG 12 hr tablet ?Commonly known as: [place] ?Take 1 tablet (600 mg total) by mouth 2 (two) times daily as needed for to loosen phlegm or cough. ?  ?saccharomyces boulardii 250 MG capsule ?Commonly known as: Florastor ?Take 1 capsule (250 mg total) by mouth 2 (two) times daily for 10 days. ?  ? ?  ? ?Allergies  ?Allergen Reactions  ? Sulfa Antibiotics Hives  ? ? Follow-up Information   ? ? June Leap L, DO Follow up.   ?Specialty: Pulmonary Disease ?Contact information: ?[address] ?[address] ?[place] [address] ?[phone] ? ? ?  ?  ? ?  [place] [place] Follow up in 1 week(s).   ?Why: hospital discharge follow up, repeat basic labs including cbc/bmp ?pcp to follow up on ddimer ?pcp to check thyroid function ( TSH) ?Contact information: ?[address] ?[place] [address] ?[phone] ? ? ?  ?  ? ?  ?  ? ?  ? ? ? ?The results of significant diagnostics from this hospitalization (including imaging, microbiology, ancillary and laboratory) are listed below for reference.   ? ?Significant Diagnostic Studies: ?CT Angio Chest PE W and/or Wo Contrast ? ?Result Date: 08/28/2021 ?CLINICAL DATA:  Chest pain. Shortness of breath. Pulmonary embolism is suspected. EXAM: CT ANGIOGRAPHY CHEST WITH CONTRAST TECHNIQUE: Multidetector CT imaging of the chest was performed using the standard protocol during bolus administration of intravenous contrast. Multiplanar CT image reconstructions and MIPs were obtained to evaluate the vascular anatomy.  RADIATION DOSE REDUCTION: This exam was performed according to the departmental dose-optimization program which includes automated exposure control, adjustment of the mA and/or kV according to patient size and/or use of iterative reconstruction technique. CONTRAST:  4m OMNIPAQUE IOHEXOL 350 MG/ML SOLN COMPARISON:  Chest radiograph 08/28/2021 FINDINGS: Cardiovascular: Negative for pulmonary embolism. Normal caliber of the thoracic aorta. Heart size is normal. No significant pericardial effusion. Mediastinum/Nodes: Enlarged subcarinal tissue measuring 1.9 cm in the short axis. There is abnormal soft tissue in the left hilum which appears to be causing endobronchial occlusion at the origin of the left lower lobe bronchus. No significant supraclavicular lymphadenopathy. No axillary lymph node enlargement. No significant right hilar lymphadenopathy. Lungs/Pleura: Trachea is patent. Evidence for a lesion causing occlusion of the left lower lobe bronchus near the origin. There also appears to be narrowing of the  left upper lobe bronchus. Collapse and consolidation of the left lower lobe. There is an additional volume loss in the left upper lobe. Right lung is clear. No pleural effusions. Upper Abdomen: Normal appearance of

## 2021-09-01 ENCOUNTER — Encounter (HOSPITAL_COMMUNITY): Payer: Self-pay | Admitting: Student

## 2021-09-01 ENCOUNTER — Encounter (HOSPITAL_COMMUNITY): Payer: Self-pay | Admitting: Pulmonary Disease

## 2021-09-02 ENCOUNTER — Telehealth: Payer: Self-pay | Admitting: Pulmonary Disease

## 2021-09-02 LAB — CULTURE, BLOOD (ROUTINE X 2)
Culture: NO GROWTH
Culture: NO GROWTH
Special Requests: ADEQUATE

## 2021-09-02 LAB — CYTOLOGY - NON PAP

## 2021-09-02 LAB — SURGICAL PATHOLOGY

## 2021-09-02 NOTE — Telephone Encounter (Signed)
Heidi Gomez mother is calling back. Patient states can speak to mother. Heidi Gomez phone number is [phone]. ?

## 2021-09-02 NOTE — Telephone Encounter (Signed)
Spoke to [place]. She is not listed on DPR. She attempted to contact patient via 3 way and call was disconnected.  ? ?

## 2021-09-02 NOTE — Telephone Encounter (Addendum)
Spoke to patient's mother, Almyra Free. Okay per patient to speak with mother.  ?Patient recently discharged. Discharge summary states to have labs with PCP.  ?Patient does not have PCP provider. Almyra Free is questioning if labs can be drawn at our office.  ?Patient have scheduled visit 09/05/2021. ? ?Dr. Valeta Harms, please advise. thanks ?

## 2021-09-02 NOTE — Telephone Encounter (Signed)
Patient's mother, Almyra Free is aware of below message and voiced her understanding.  ?Nothing further needed.  ? ?

## 2021-09-05 ENCOUNTER — Ambulatory Visit (INDEPENDENT_AMBULATORY_CARE_PROVIDER_SITE_OTHER): Payer: Self-pay

## 2021-09-05 ENCOUNTER — Ambulatory Visit (INDEPENDENT_AMBULATORY_CARE_PROVIDER_SITE_OTHER): Payer: Self-pay | Admitting: Pulmonary Disease

## 2021-09-05 ENCOUNTER — Encounter: Payer: Self-pay | Admitting: Pulmonary Disease

## 2021-09-05 VITALS — BP 128/84 | HR 116 | Temp 98.3°F | Ht 62.0 in | Wt 183.2 lb

## 2021-09-05 DIAGNOSIS — C7A090 Malignant carcinoid tumor of the bronchus and lung: Secondary | ICD-10-CM

## 2021-09-05 DIAGNOSIS — R918 Other nonspecific abnormal finding of lung field: Secondary | ICD-10-CM

## 2021-09-05 NOTE — Patient Instructions (Signed)
Thank you for visiting Dr. Valeta Harms at [facility]. ?Today we recommend the following: ? ?Orders Placed This Encounter  ?Procedures  ? DG Chest 2 View  ? Ambulatory referral to Cardiothoracic Surgery  ? ? ?Return in about 3 months (around 12/06/2021) for w/ Dr. Valeta Harms . ? ? ? ?Please do your part to reduce the spread of COVID-19.  ? ?

## 2021-09-05 NOTE — Progress Notes (Signed)
? ?Synopsis: Referred in my March 2023 for follow-up from hospital status post bronchoscopy by No ref. provider found ? ?Subjective:  ? ?PATIENT ID: Heidi Gomez GENDER: female DOB: May 12, 1991, MRN: [id number] ? ?Chief Complaint  ?Patient presents with  ? Consult  ?  Pt had a bronch performed 3/23. States she has had a cough for about 2 weeks which is getting better. Will occasionally cough up clear phlegm. Also has some SOB. Denies any complaints of wheezing.  ? ? ?This is a 31 year old female, no significant past medical history presented to the hospital with shortness of breath found of CT scan of the chest in the emergency room which found a left lower lobe and left mainstem endobronchial tumor with left lower lobe collapse.  Patient was taken for bronchoscopy for tissue sampling, cryotherapy, tumor debulking and lesional excision in the left mainstem with an attempt to open up the left lower lobe.  Please see operative note as well as images in the operative note.  Pathology confirmed typical carcinoid. ? ? ? ?Past Medical History:  ?Diagnosis Date  ? Carcinoid tumor   ?  ? ?Family History  ?Problem Relation Age of Onset  ? Rheumatologic disease Father   ? Bladder Cancer Maternal Grandmother   ?  ? ?Past Surgical History:  ?Procedure Laterality Date  ? CRYOTHERAPY  08/29/2021  ? Procedure: CRYOTHERAPY;  Surgeon: Garner Nash, DO;  Location: [facility];  Service: Pulmonary;;  ? FINE NEEDLE ASPIRATION BIOPSY  08/29/2021  ? Procedure: FINE NEEDLE ASPIRATION BIOPSY;  Surgeon: Garner Nash, DO;  Location: [place];  Service: Pulmonary;;  ? VIDEO BRONCHOSCOPY Left 08/29/2021  ? Procedure: VIDEO BRONCHOSCOPY WITH FLUORO;  Surgeon: Garner Nash, DO;  Location: [place];  Service: Pulmonary;  Laterality: Left;  w/ cyrotherapy  ? VIDEO BRONCHOSCOPY WITH ENDOBRONCHIAL ULTRASOUND N/A 08/29/2021  ? Procedure: VIDEO BRONCHOSCOPY WITH ENDOBRONCHIAL ULTRASOUND;  Surgeon: Garner Nash, DO;  Location: [place];  Service: Pulmonary;  Laterality: N/A;  ? ? ?Social History  ? ?Socioeconomic History  ? Marital status: Single  ?  Spouse name: Not on file  ? Number of children: Not on file  ? Years of education: Not on file  ? Highest education level: Not on file  ?Occupational History  ? Not on file  ?Tobacco Use  ? Smoking status: Never  ? Smokeless tobacco: Never  ?Substance and Sexual Activity  ? Alcohol use: Not on file  ? Drug use: Not on file  ? Sexual activity: Not on file  ?Other Topics Concern  ? Not on file  ?Social History Narrative  ? Not on file  ? ?Social Determinants of Health  ? ?Financial Resource Strain: Not on file  ?Food Insecurity: Not on file  ?Transportation Needs: Not on file  ?Physical Activity: Not on file  ?Stress: Not on file  ?Social Connections: Not on file  ?Intimate Partner Violence: Not on file  ?  ? ?Allergies  ?Allergen Reactions  ? Sulfa Antibiotics Hives  ?  ? ?Outpatient Medications Prior to Visit  ?Medication Sig Dispense Refill  ? ALPRAZolam (XANAX) 0.25 MG tablet Take 1 tablet (0.25 mg total) by mouth 2 (two) times daily as needed for anxiety. 10 tablet 0  ? amoxicillin-clavulanate (AUGMENTIN) 875-125 MG tablet Take 1 tablet by mouth every 12 (twelve) hours for 10 days. 20 tablet 0  ? guaiFENesin (MUCINEX) 600 MG 12 hr tablet Take 1 tablet (600 mg total) by mouth 2 (two) times daily  as needed for to loosen phlegm or cough.    ? Multiple Vitamin (MULTIVITAMIN) tablet Take 1 tablet by mouth daily.    ? saccharomyces boulardii (FLORASTOR) 250 MG capsule Take 1 capsule (250 mg total) by mouth 2 (two) times daily for 10 days. 20 capsule 0  ? SUMAtriptan-naproxen (TREXIMET) 85-500 MG tablet Take 1 tablet by mouth every 2 (two) hours as needed for migraine.    ? TURMERIC PO Take by mouth.    ? ?No facility-administered medications prior to visit.  ? ? ?Review of Systems  ?Constitutional:  Negative for chills, fever, malaise/fatigue and weight loss.  ?HENT:  Negative for hearing loss,  sore throat and tinnitus.   ?Eyes:  Negative for blurred vision and double vision.  ?Respiratory:  Positive for shortness of breath. Negative for cough, hemoptysis, sputum production, wheezing and stridor.   ?Cardiovascular:  Negative for chest pain, palpitations, orthopnea, leg swelling and PND.  ?Gastrointestinal:  Negative for abdominal pain, constipation, diarrhea, heartburn, nausea and vomiting.  ?Genitourinary:  Negative for dysuria, hematuria and urgency.  ?Musculoskeletal:  Negative for joint pain and myalgias.  ?Skin:  Negative for itching and rash.  ?Neurological:  Negative for dizziness, tingling, weakness and headaches.  ?Endo/Heme/Allergies:  Negative for environmental allergies. Does not bruise/bleed easily.  ?Psychiatric/Behavioral:  Negative for depression. The patient is not nervous/anxious and does not have insomnia.   ?All other systems reviewed and are negative. ? ? ?Objective:  ?Physical Exam ?Vitals reviewed.  ?Constitutional:   ?   General: She is not in acute distress. ?   Appearance: She is well-developed.  ?HENT:  ?   Head: Normocephalic and atraumatic.  ?Eyes:  ?   General: No scleral icterus. ?   Conjunctiva/sclera: Conjunctivae normal.  ?   Pupils: Pupils are equal, round, and reactive to light.  ?Neck:  ?   Vascular: No JVD.  ?   Trachea: No tracheal deviation.  ?Cardiovascular:  ?   Rate and Rhythm: Normal rate and regular rhythm.  ?   Heart sounds: Normal heart sounds. No murmur heard. ?Pulmonary:  ?   Effort: Pulmonary effort is normal. No tachypnea, accessory muscle usage or respiratory distress.  ?   Breath sounds: No stridor. No wheezing, rhonchi or rales.  ?   Comments: Diminished breath sounds in the left base ?Abdominal:  ?   General: There is no distension.  ?   Palpations: Abdomen is soft.  ?   Tenderness: There is no abdominal tenderness.  ?Musculoskeletal:     ?   General: No tenderness.  ?   Cervical back: Neck supple.  ?Lymphadenopathy:  ?   Cervical: No cervical  adenopathy.  ?Skin: ?   General: Skin is warm and dry.  ?   Capillary Refill: Capillary refill takes less than 2 seconds.  ?   Findings: No rash.  ?Neurological:  ?   Mental Status: She is alert and oriented to person, place, and time.  ?Psychiatric:     ?   Behavior: Behavior normal.  ? ? ? ?Vitals:  ? 09/05/21 1504  ?BP: 128/84  ?Pulse: (!) 116  ?Temp: 98.3 ?F (36.8 ?C)  ?TempSrc: Oral  ?SpO2: 100%  ?Weight: 183 lb 3.2 oz (83.1 kg)  ?Height: '5\' 2"'$  (1.575 m)  ? ?100% on RA ?BMI Readings from Last 3 Encounters:  ?09/05/21 33.51 kg/m?  ?08/29/21 33.83 kg/m?  ?01/23/12 26.89 kg/m?  ? ?Wt Readings from Last 3 Encounters:  ?09/05/21 183 lb 3.2 oz (83.1 kg)  ?  08/29/21 184 lb 15.5 oz (83.9 kg)  ?01/23/12 147 lb (66.7 kg)  ? ? ? ?CBC ?   ?Component Value Date/Time  ? WBC 16.8 (H) 08/30/2021 0354  ? RBC 3.80 (L) 08/30/2021 0354  ? HGB 11.2 (L) 08/30/2021 0354  ? HCT 33.6 (L) 08/30/2021 0354  ? PLT 524 (H) 08/30/2021 0354  ? MCV 88.4 08/30/2021 0354  ? MCH 29.5 08/30/2021 0354  ? MCHC 33.3 08/30/2021 0354  ? RDW 12.4 08/30/2021 0354  ? LYMPHSABS 1.9 08/30/2021 0354  ? MONOABS 1.2 (H) 08/30/2021 0354  ? EOSABS 0.0 08/30/2021 0354  ? BASOSABS 0.0 08/30/2021 0354  ? ? ? ?Chest Imaging: ?09/05/2021: Chest x-ray: ?Left lower lobe collapse still persistent. ?The patient's images have been independently reviewed by me.   ? ?Pulmonary Functions Testing Results: ?   ? View : No data to display.  ?  ?  ?  ? ? ?FeNO:  ? ?Pathology:  ? ?Echocardiogram:  ? ?Heart Catheterization:  ?   ?Assessment & Plan:  ? ?  ICD-10-CM   ?1. Endobronchial mass  R91.8 DG Chest 2 View  ?  Ambulatory referral to Cardiothoracic Surgery  ?  ?2. 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  ? ? ?Discussion: ? ?This is a 31 year old female with a malignant carcinoid, typical carcinoid within the left lower lobe, left mainstem and involving the left main subcarina. ? ?Plan: ?I think that she needs to meet  with thoracic surgery.  We talked about various options. ?I have spoke with our thoracic surgery team here. ?Due to the location of the tumor and proximity and concern for possible need of pneumonectomy for su

## 2021-09-08 NOTE — Anesthesia Postprocedure Evaluation (Signed)
Anesthesia Post Note ? ?Patient: Heidi Gomez ? ?Procedure(s) Performed: VIDEO BRONCHOSCOPY WITH FLUORO (Left) ?VIDEO BRONCHOSCOPY WITH ENDOBRONCHIAL ULTRASOUND ?CRYOTHERAPY ?FINE NEEDLE ASPIRATION BIOPSY ? ?  ? ?Patient location during evaluation: PACU ?Anesthesia Type: General ?Level of consciousness: awake and alert ?Pain management: pain level controlled ?Vital Signs Assessment: post-procedure vital signs reviewed and stable ?Respiratory status: spontaneous breathing, nonlabored ventilation, respiratory function stable and patient connected to nasal cannula oxygen ?Cardiovascular status: blood pressure returned to baseline and stable ?Postop Assessment: no apparent nausea or vomiting ?Anesthetic complications: no ? ? ?No notable events documented. ? ?Last Vitals:  ?Vitals:  ? 08/30/21 0534 08/30/21 1155  ?BP: 131/88 110/68  ?Pulse: 95 86  ?Resp: 20 15  ?Temp: 36.8 ?C (!) 36.4 ?C  ?SpO2:  98%  ?  ?Last Pain:  ?Vitals:  ? 08/30/21 1155  ?TempSrc: Oral  ?PainSc:   ? ? ?  ?  ?  ?  ?  ?  ? ?[name] [name] ? ? ? ? ?

## 2021-09-09 ENCOUNTER — Encounter: Payer: Self-pay | Admitting: Pulmonary Disease

## 2021-09-09 NOTE — Telephone Encounter (Signed)
Dr. Valeta Harms, please see mychart message sent by pt and mom. ?

## 2021-10-03 ENCOUNTER — Encounter: Payer: Self-pay | Admitting: Pulmonary Disease

## 2021-12-02 ENCOUNTER — Encounter: Payer: Self-pay | Admitting: Pulmonary Disease

## 2021-12-02 ENCOUNTER — Ambulatory Visit (INDEPENDENT_AMBULATORY_CARE_PROVIDER_SITE_OTHER): Payer: BLUE CROSS/BLUE SHIELD | Admitting: Pulmonary Disease

## 2021-12-02 ENCOUNTER — Encounter: Payer: Self-pay | Admitting: *Deleted

## 2021-12-02 VITALS — BP 124/88 | HR 96 | Temp 98.0°F | Ht 62.0 in | Wt 190.0 lb

## 2021-12-02 DIAGNOSIS — R918 Other nonspecific abnormal finding of lung field: Secondary | ICD-10-CM

## 2021-12-02 DIAGNOSIS — C7A090 Malignant carcinoid tumor of the bronchus and lung: Secondary | ICD-10-CM | POA: Diagnosis not present

## 2021-12-02 DIAGNOSIS — Z902 Acquired absence of lung [part of]: Secondary | ICD-10-CM

## 2021-12-02 NOTE — Progress Notes (Signed)
Synopsis: Referred in my March 2023 for follow-up from hospital status post bronchoscopy by No ref. provider found  Subjective:   PATIENT ID: Heidi Gomez GENDER: female DOB: June 05, 1991, MRN: [id number]  Chief Complaint  Patient presents with   Follow-up    Follow up. Patient has no complaints.     This is a 31 year old female, no significant past medical history presented to the hospital with shortness of breath found of CT scan of the chest in the emergency room which found a left lower lobe and left mainstem endobronchial tumor with left lower lobe collapse.  Patient was taken for bronchoscopy for tissue sampling, cryotherapy, tumor debulking and lesional excision in the left mainstem with an attempt to open up the left lower lobe.  Please see operative note as well as images in the operative note.  Pathology confirmed typical carcinoid.  OV 12/02/2021: Patient here today for follow-up after left-sided pneumonectomy completed by Dr. Ewing Schlein at [facility].  Patient doing very well postoperatively.  She is going to have follow-up scheduled likely with [place] if she can get her insurance straightened out.  She is worried about maintaining an in network provider.  I explained that we would try to work with her the best that we can to make sure that we get all of the follow-up that she needs.  She is going to need a repeat CT chest in November 2023.     Past Medical History:  Diagnosis Date   Carcinoid tumor      Family History  Problem Relation Age of Onset   Rheumatologic disease Father    Bladder Cancer Maternal Grandmother      Past Surgical History:  Procedure Laterality Date   CRYOTHERAPY  08/29/2021   Procedure: CRYOTHERAPY;  Surgeon: Josephine Igo, DO;  Location: [facility];  Service: Pulmonary;;   FINE NEEDLE ASPIRATION BIOPSY  08/29/2021   Procedure: FINE NEEDLE ASPIRATION BIOPSY;  Surgeon: Josephine Igo, DO;  Location: [facility];  Service: Pulmonary;;   VIDEO  BRONCHOSCOPY Left 08/29/2021   Procedure: VIDEO BRONCHOSCOPY WITH FLUORO;  Surgeon: Josephine Igo, DO;  Location: [facility];  Service: Pulmonary;  Laterality: Left;  w/ cyrotherapy   VIDEO BRONCHOSCOPY WITH ENDOBRONCHIAL ULTRASOUND N/A 08/29/2021   Procedure: VIDEO BRONCHOSCOPY WITH ENDOBRONCHIAL ULTRASOUND;  Surgeon: Josephine Igo, DO;  Location: [facility];  Service: Pulmonary;  Laterality: N/A;    Social History   Socioeconomic History   Marital status: Single    Spouse name: Not on file   Number of children: Not on file   Years of education: Not on file   Highest education level: Not on file  Occupational History   Not on file  Tobacco Use   Smoking status: Never   Smokeless tobacco: Never  Substance and Sexual Activity   Alcohol use: Not on file   Drug use: Not on file   Sexual activity: Not on file  Other Topics Concern   Not on file  Social History Narrative   Not on file   Social Determinants of Health   Financial Resource Strain: Not on file  Food Insecurity: Not on file  Transportation Needs: Not on file  Physical Activity: Not on file  Stress: Not on file  Social Connections: Not on file  Intimate Partner Violence: Not on file     Allergies  Allergen Reactions   Sulfa Antibiotics Hives     Outpatient Medications Prior to Visit  Medication Sig Dispense Refill  ALPRAZolam (XANAX) 0.25 MG tablet Take 1 tablet (0.25 mg total) by mouth 2 (two) times daily as needed for anxiety. 10 tablet 0   Multiple Vitamin (MULTIVITAMIN) tablet Take 1 tablet by mouth daily.     TURMERIC PO Take by mouth.     guaiFENesin (MUCINEX) 600 MG 12 hr tablet Take 1 tablet (600 mg total) by mouth 2 (two) times daily as needed for to loosen phlegm or cough.     SUMAtriptan-naproxen (TREXIMET) 85-500 MG tablet Take 1 tablet by mouth every 2 (two) hours as needed for migraine.     No facility-administered medications prior to visit.    Review of Systems  Constitutional:   Negative for chills, fever, malaise/fatigue and weight loss.  HENT:  Negative for hearing loss, sore throat and tinnitus.   Eyes:  Negative for blurred vision and double vision.  Respiratory:  Positive for shortness of breath. Negative for cough, hemoptysis, sputum production, wheezing and stridor.   Cardiovascular:  Negative for chest pain, palpitations, orthopnea, leg swelling and PND.  Gastrointestinal:  Negative for abdominal pain, constipation, diarrhea, heartburn, nausea and vomiting.  Genitourinary:  Negative for dysuria, hematuria and urgency.  Musculoskeletal:  Negative for joint pain and myalgias.  Skin:  Negative for itching and rash.  Neurological:  Negative for dizziness, tingling, weakness and headaches.  Endo/Heme/Allergies:  Negative for environmental allergies. Does not bruise/bleed easily.  Psychiatric/Behavioral:  Negative for depression. The patient is not nervous/anxious and does not have insomnia.   All other systems reviewed and are negative.    Objective:  Physical Exam Vitals reviewed.  Constitutional:      General: She is not in acute distress.    Appearance: She is well-developed.  HENT:     Head: Normocephalic and atraumatic.  Eyes:     General: No scleral icterus.    Conjunctiva/sclera: Conjunctivae normal.     Pupils: Pupils are equal, round, and reactive to light.  Neck:     Vascular: No JVD.     Trachea: No tracheal deviation.  Cardiovascular:     Rate and Rhythm: Normal rate and regular rhythm.     Heart sounds: Normal heart sounds. No murmur heard. Pulmonary:     Effort: Pulmonary effort is normal. No tachypnea, accessory muscle usage or respiratory distress.     Breath sounds: No stridor. No wheezing, rhonchi or rales.     Comments: Diminished breath sounds in the left chest they are however transmitted from the right. Abdominal:     General: Bowel sounds are normal. There is no distension.     Palpations: Abdomen is soft.     Tenderness:  There is no abdominal tenderness.  Musculoskeletal:        General: No tenderness.     Cervical back: Neck supple.  Lymphadenopathy:     Cervical: No cervical adenopathy.  Skin:    General: Skin is warm and dry.     Capillary Refill: Capillary refill takes less than 2 seconds.     Findings: No rash.  Neurological:     Mental Status: She is alert and oriented to person, place, and time.  Psychiatric:        Behavior: Behavior normal.      Vitals:   12/02/21 1345  BP: 124/88  Pulse: 96  Temp: 98 F (36.7 C)  TempSrc: Oral  SpO2: 98%  Weight: 190 lb (86.2 kg)  Height: 5\' 2"  (1.575 m)   98% on RA BMI Readings from Last 3  Encounters:  12/02/21 34.75 kg/m  09/05/21 33.51 kg/m  08/29/21 33.83 kg/m   Wt Readings from Last 3 Encounters:  12/02/21 190 lb (86.2 kg)  09/05/21 183 lb 3.2 oz (83.1 kg)  08/29/21 184 lb 15.5 oz (83.9 kg)     CBC    Component Value Date/Time   WBC 16.8 (H) 08/30/2021 0354   RBC 3.80 (L) 08/30/2021 0354   HGB 11.2 (L) 08/30/2021 0354   HCT 33.6 (L) 08/30/2021 0354   PLT 524 (H) 08/30/2021 0354   MCV 88.4 08/30/2021 0354   MCH 29.5 08/30/2021 0354   MCHC 33.3 08/30/2021 0354   RDW 12.4 08/30/2021 0354   LYMPHSABS 1.9 08/30/2021 0354   MONOABS 1.2 (H) 08/30/2021 0354   EOSABS 0.0 08/30/2021 0354   BASOSABS 0.0 08/30/2021 0354     Chest Imaging: 09/05/2021: Chest x-ray: Left lower lobe collapse still persistent. The patient's images have been independently reviewed by me.    Pulmonary Functions Testing Results:     No data to display          FeNO:   Pathology:   Echocardiogram:   Heart Catheterization:     Assessment & Plan:     ICD-10-CM   1. Endobronchial mass  R91.8     2. Malignant carcinoid tumor of bronchus ([facility])  C7A.090     3. S/P pneumonectomy  Z90.2       Discussion:  This is a 31 year old female with a malignant carcinoid, typical, within the left lower lobe and left mainstem and involving the  left main subcarina status post pneumonectomy completed at [facility].  Plan: Patient is doing well post surgery. She will need a repeat CT scan of the chest in November 2023. We will try to get her established with medical oncology that is also in network for her insurance provider. She is going to call us and let us know if we need to place this order to help facilitate imaging in November. She would like to stay local without having to travel if possible. We appreciate the help from [facility] thoracic surgery.    Current Outpatient Medications:    ALPRAZolam (XANAX) 0.25 MG tablet, Take 1 tablet (0.25 mg total) by mouth 2 (two) times daily as needed for anxiety., Disp: 10 tablet, Rfl: 0   Multiple Vitamin (MULTIVITAMIN) tablet, Take 1 tablet by mouth daily., Disp: , Rfl:    TURMERIC PO, Take by mouth., Disp: , Rfl:    Josephine Igo, DO [facility] 12/02/2021 2:58 PM

## 2021-12-03 ENCOUNTER — Telehealth: Payer: Self-pay | Admitting: Internal Medicine

## 2021-12-03 NOTE — Telephone Encounter (Signed)
Scheduled appt per 6/26 staff msg. Pt is aware of appt date and time. Pt is aware to arrive 15 mins prior to appt time and to bring and updated insurance card. Pt is aware of appt location.

## 2021-12-12 ENCOUNTER — Telehealth: Payer: Self-pay | Admitting: Internal Medicine

## 2021-12-12 NOTE — Telephone Encounter (Signed)
Pt called in yesterday and left a msg requesting to cancel her appt with Dr. Julien Nordmann. I called pt back today and spoke to her. She said her insurance was not going to cover this appt and she would need to cancel for right now. She declined to r/s. I cancelled her appt.

## 2021-12-13 ENCOUNTER — Encounter: Payer: Self-pay | Admitting: Pulmonary Disease

## 2021-12-13 DIAGNOSIS — R918 Other nonspecific abnormal finding of lung field: Secondary | ICD-10-CM

## 2021-12-13 NOTE — Telephone Encounter (Signed)
Placed orders for Oncology referral and CT. Notified pt via My Chart. Nothing further needed at this time.

## 2021-12-13 NOTE — Telephone Encounter (Signed)
Dr. Valeta Harms, per your last OV note you advised pt to have a repeat CT chest in November 2023. Per PCC's this can be done at an [facility] site for pt's insurance coverage. Also, pt has canceled her referral with Dr. Julien Nordmann due to provider being out of network. Please advise what kind of CT chest you would like pt to have (last was CTA in 08/2021) and if still ok to refer pt to medical oncology with [facility]. Thanks!  Last OV note from 12/02/2021: Plan: Patient is doing well post surgery. She will need a repeat CT scan of the chest in November 2023. We will try to get her established with medical oncology that is also in network for her insurance provider. She is going to call us and let us know if we need to place this order to help facilitate imaging in November. She would like to stay local without having to travel if possible. We appreciate the help from [facility] thoracic surgery.

## 2021-12-23 ENCOUNTER — Other Ambulatory Visit: Payer: BLUE CROSS/BLUE SHIELD

## 2021-12-23 ENCOUNTER — Ambulatory Visit: Payer: BLUE CROSS/BLUE SHIELD | Admitting: Internal Medicine

## 2022-02-06 ENCOUNTER — Telehealth: Payer: Self-pay | Admitting: Pulmonary Disease

## 2022-02-07 NOTE — Telephone Encounter (Signed)
Will route to provider.   BI please advise. Thanks :)

## 2022-10-17 IMAGING — CT CT ANGIO CHEST
2 of 6 series · 17 of 46 positions shown · IV contrast (agent unspecified)
Comparison: Chest radiograph 08/28/2021

CLINICAL DATA: Chest pain. Shortness of breath. Pulmonary embolism
is suspected.

EXAM:
CT ANGIOGRAPHY CHEST WITH CONTRAST
TECHNIQUE: Multidetector CT imaging of the chest was performed using the
standard protocol during bolus administration of intravenous
contrast. Multiplanar CT image reconstructions and MIPs were
obtained to evaluate the vascular anatomy.

[Series 8: thins · axial · 0.88mm/px · z∈[-412,-127]mm · 14 of 313 slices shown]
[im 14/313  lung]
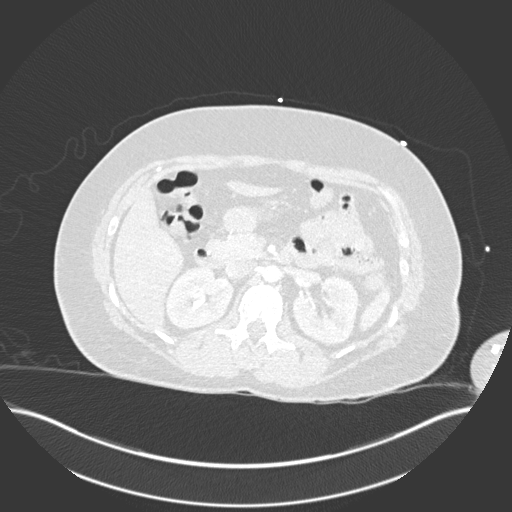
[im 41/313  soft-tissue]
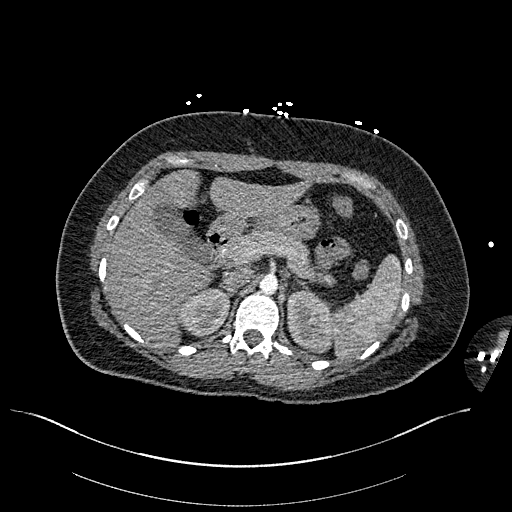
[im 55/313  lung]
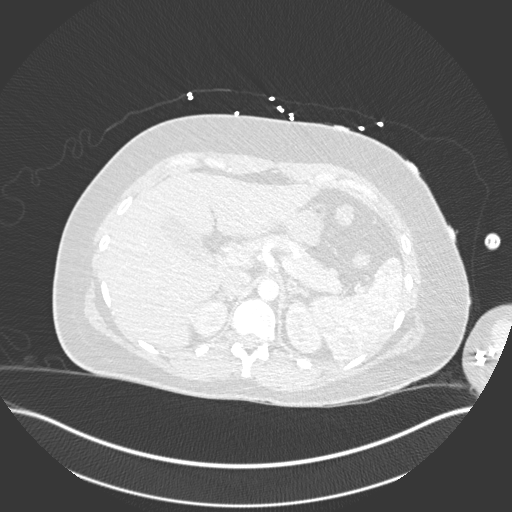
[im 82/313  soft-tissue]
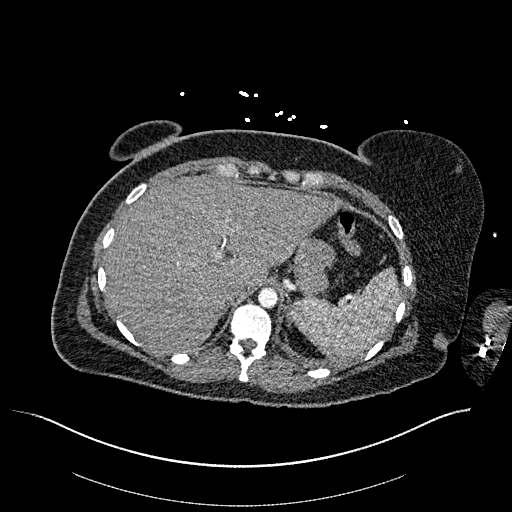
[im 109/313  lung]
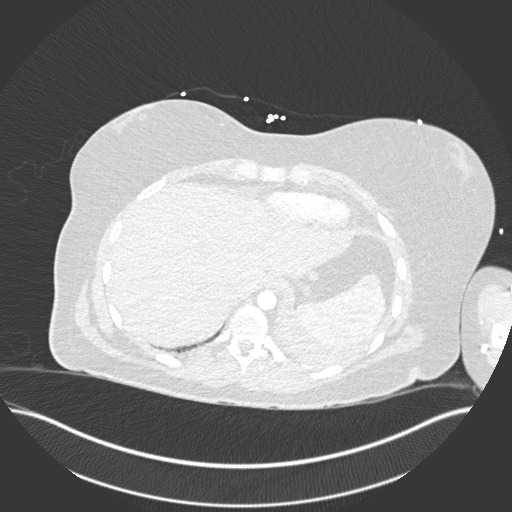
[im 123/313  soft-tissue]
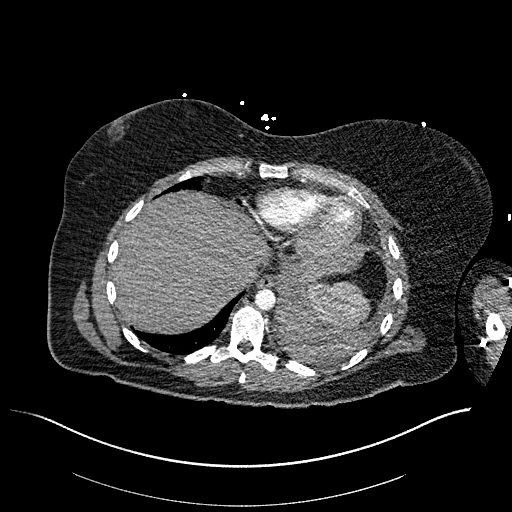
[im 150/313  lung]
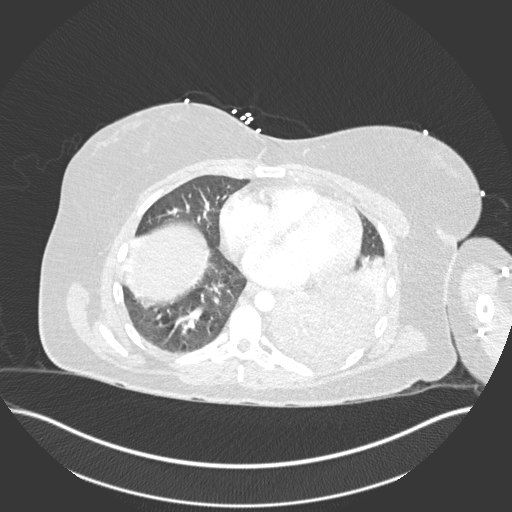
[im 163/313  soft-tissue]
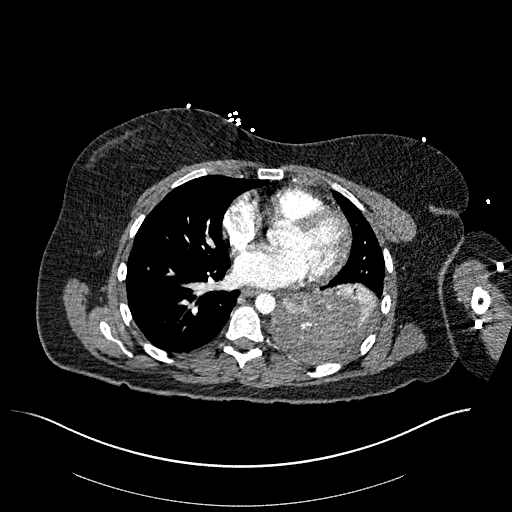
[im 190/313  lung]
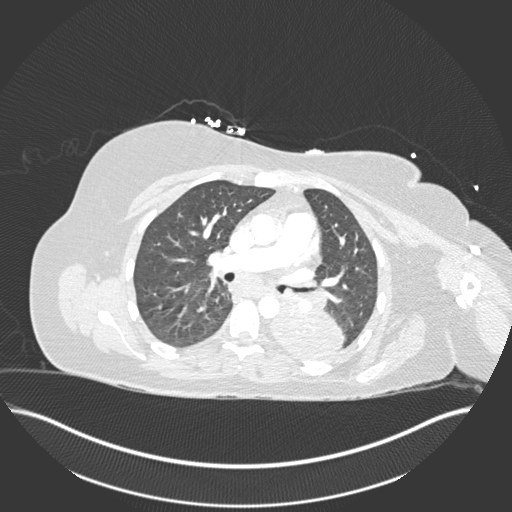
[im 204/313  soft-tissue]
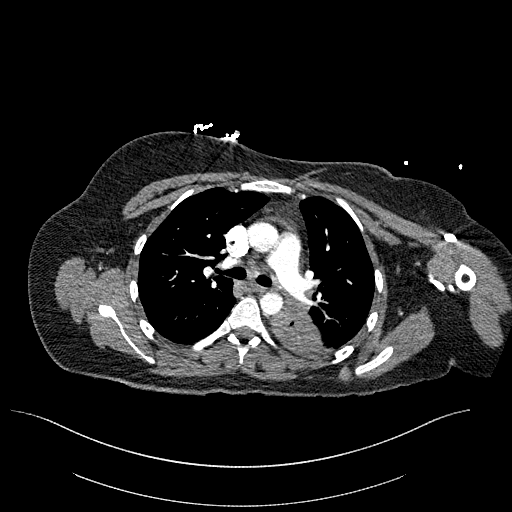
[im 231/313  lung]
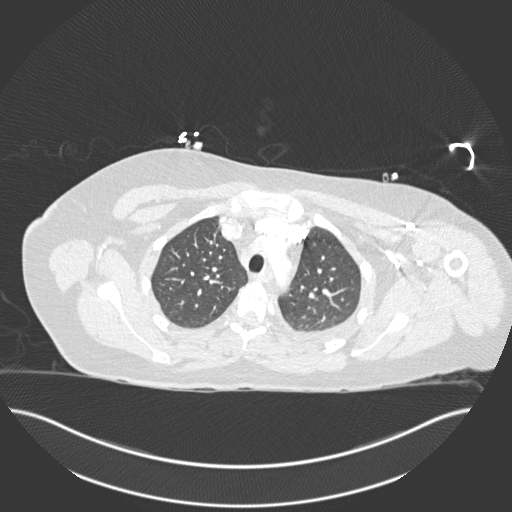
[im 258/313  soft-tissue]
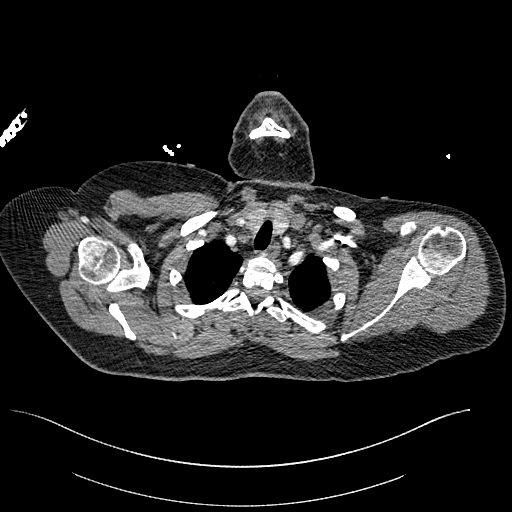
[im 272/313  lung]
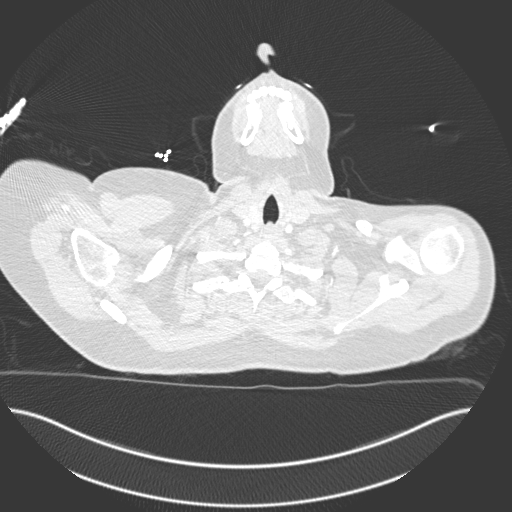
[im 299/313  soft-tissue]
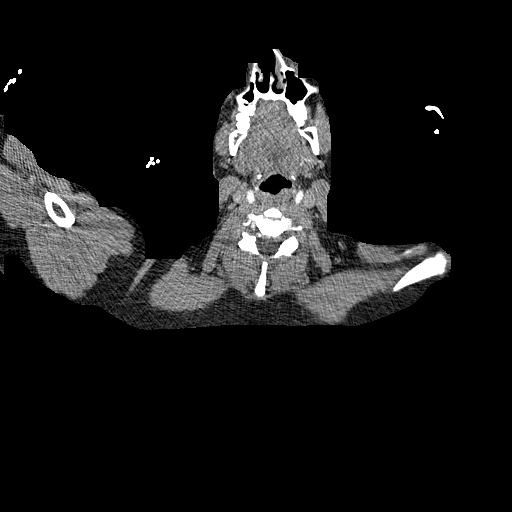

[Series 10: coronal mpr · coronal · 0.62mm/px · 3 of 151 slices shown]
[im 38/151  soft-tissue]
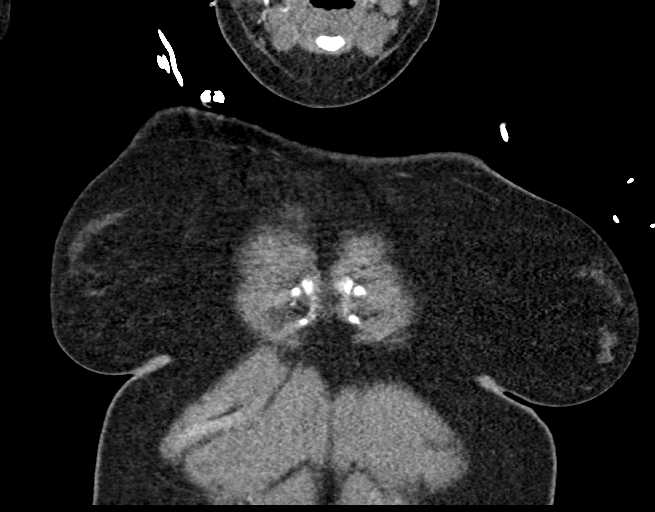
[im 76/151  soft-tissue]
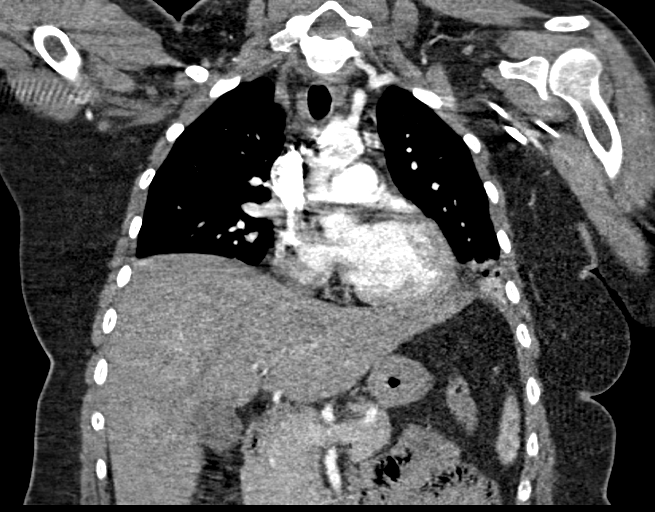
[im 113/151  soft-tissue]
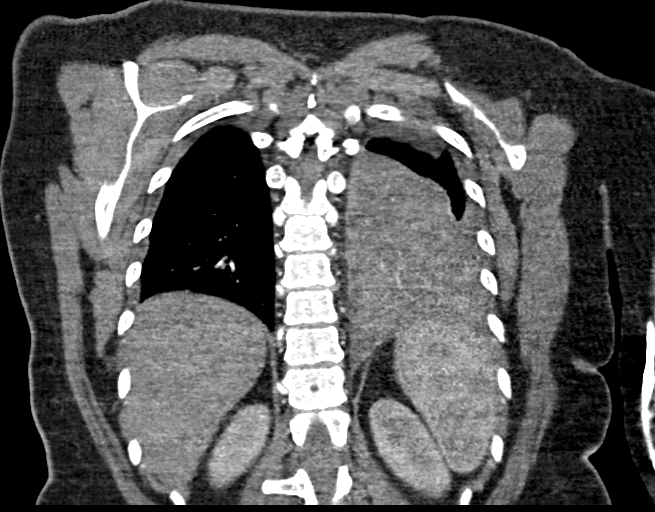

[17 of 46 positions shown; findings below may reference images not displayed]

RADIATION DOSE REDUCTION: This exam was performed according to the
departmental dose-optimization program which includes automated
exposure control, adjustment of the mA and/or kV according to
patient size and/or use of iterative reconstruction technique.

CONTRAST:  65mL OMNIPAQUE IOHEXOL 350 MG/ML SOLN
FINDINGS: Cardiovascular: Negative for pulmonary embolism. Normal caliber of
the thoracic aorta. Heart size is normal. No significant pericardial
effusion.

Mediastinum/Nodes: Enlarged subcarinal tissue measuring 1.9 cm in
the short axis. There is abnormal soft tissue in the left hilum
which appears to be causing endobronchial occlusion at the origin of
the left lower lobe bronchus. No significant supraclavicular
lymphadenopathy. No axillary lymph node enlargement. No significant
right hilar lymphadenopathy.

Lungs/Pleura: Trachea is patent. Evidence for a lesion causing
occlusion of the left lower lobe bronchus near the origin. There
also appears to be narrowing of the left upper lobe bronchus.
Collapse and consolidation of the left lower lobe. There is an
additional volume loss in the left upper lobe. Right lung is clear.
No pleural effusions.

Upper Abdomen: Normal appearance of the adrenal glands. Images of
the upper abdomen are unremarkable. There is no significant lymph
node enlargement in the upper abdomen.

Musculoskeletal: No suspicious osseous lesions.

Review of the MIP images confirms the above findings.
IMPRESSION: 1. Collapse and consolidation throughout the left lower lobe appears
to be related to an endobronchial lesion and occlusion. Enlarged
mediastinal lymph nodes and concern for abnormal soft tissue in the
left hilum. Findings raise concern for a neoplastic process.
Recommend pulmonary consultation.
2. Negative for pulmonary embolism.

These results were called by telephone at the time of interpretation
on 08/28/2021 at [DATE] to provider [HOSPITAL] [place] , who verbally
acknowledged these results.

## 2022-10-25 IMAGING — DX DG CHEST 2V
2 series · 2 of 2 positions shown · non-contrast
Comparison: Chest x-ray 08/28/2021.  Chest CT 08/28/2021.

CLINICAL DATA: Post bronchoscopy.

EXAM:
CHEST - 2 VIEW

[chest lat]
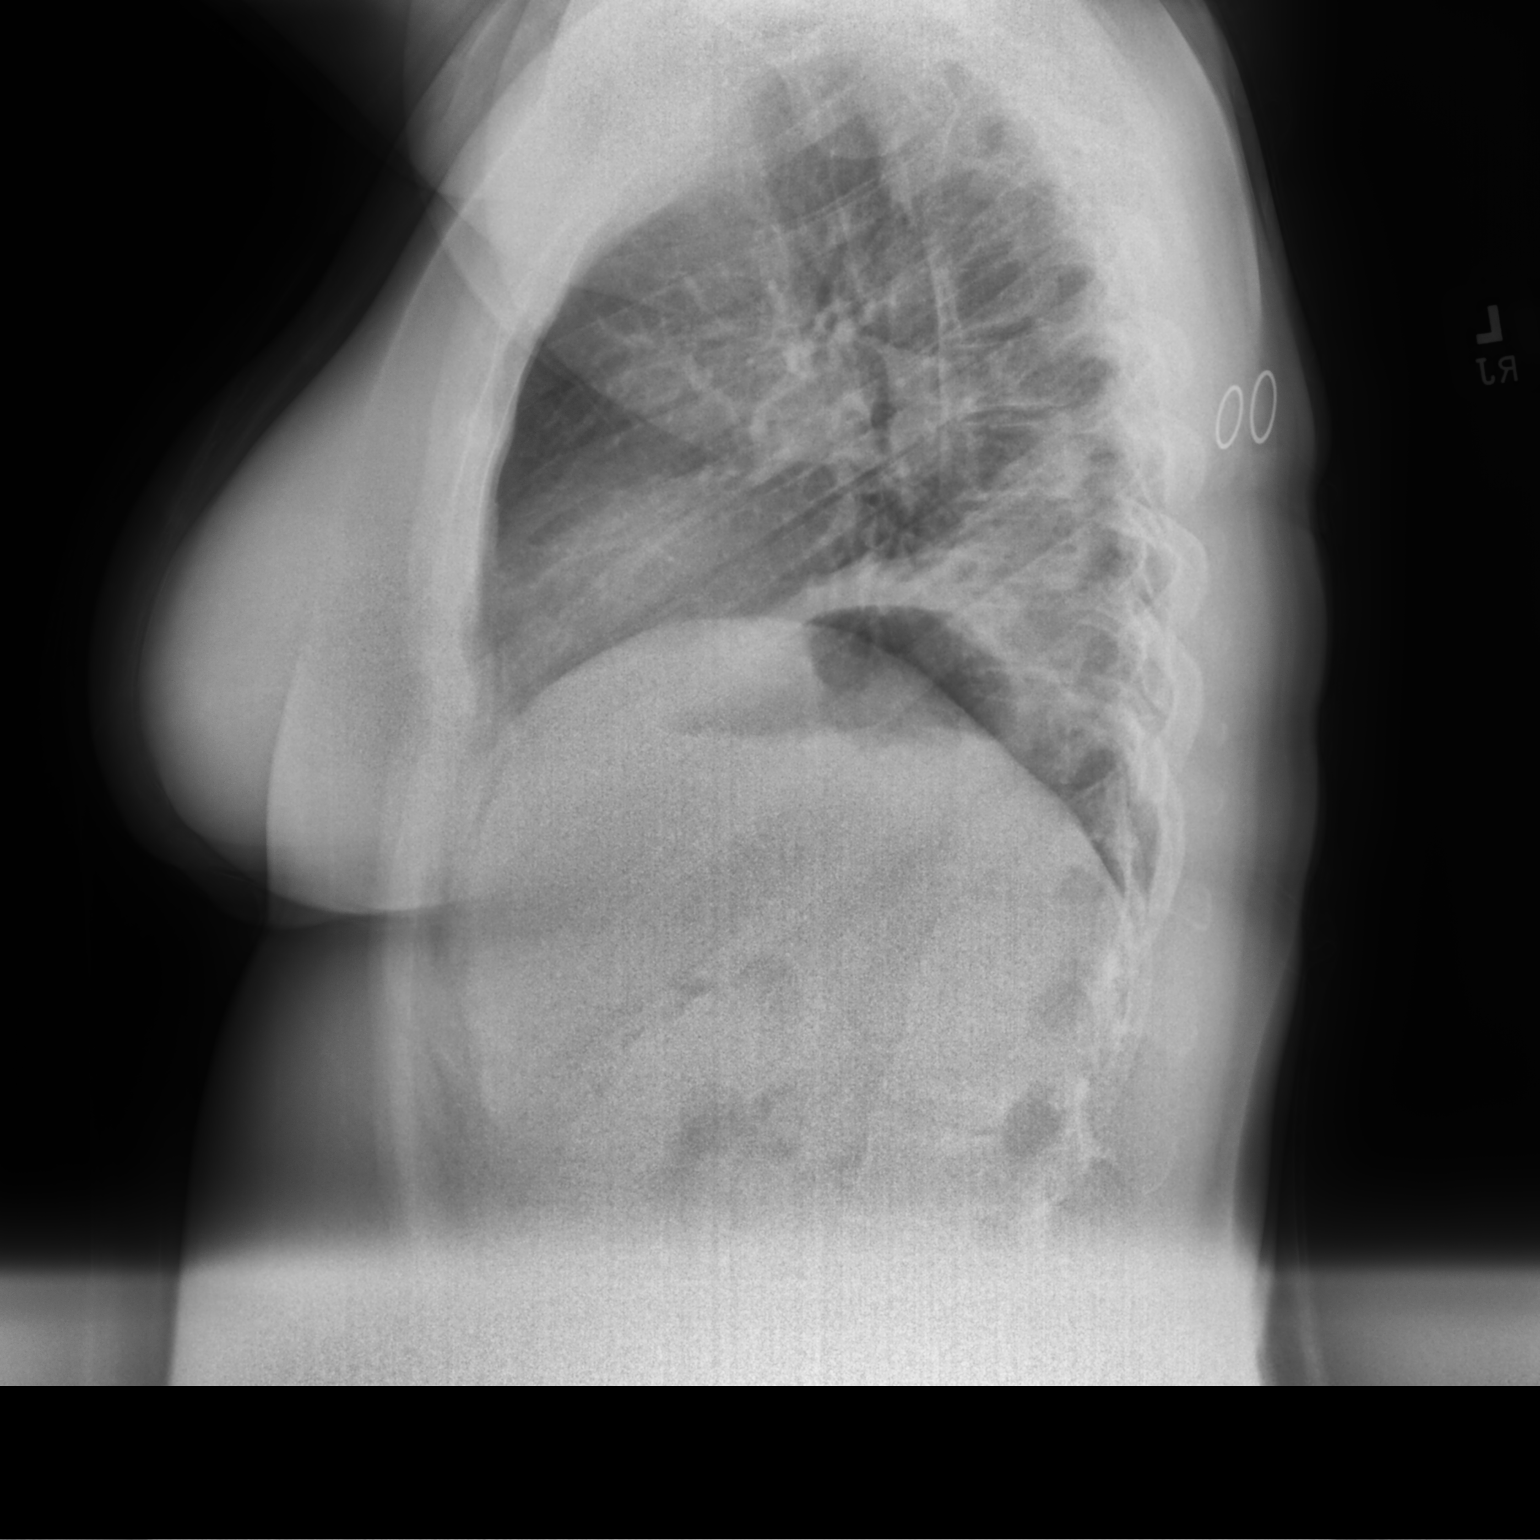

[chest pa]
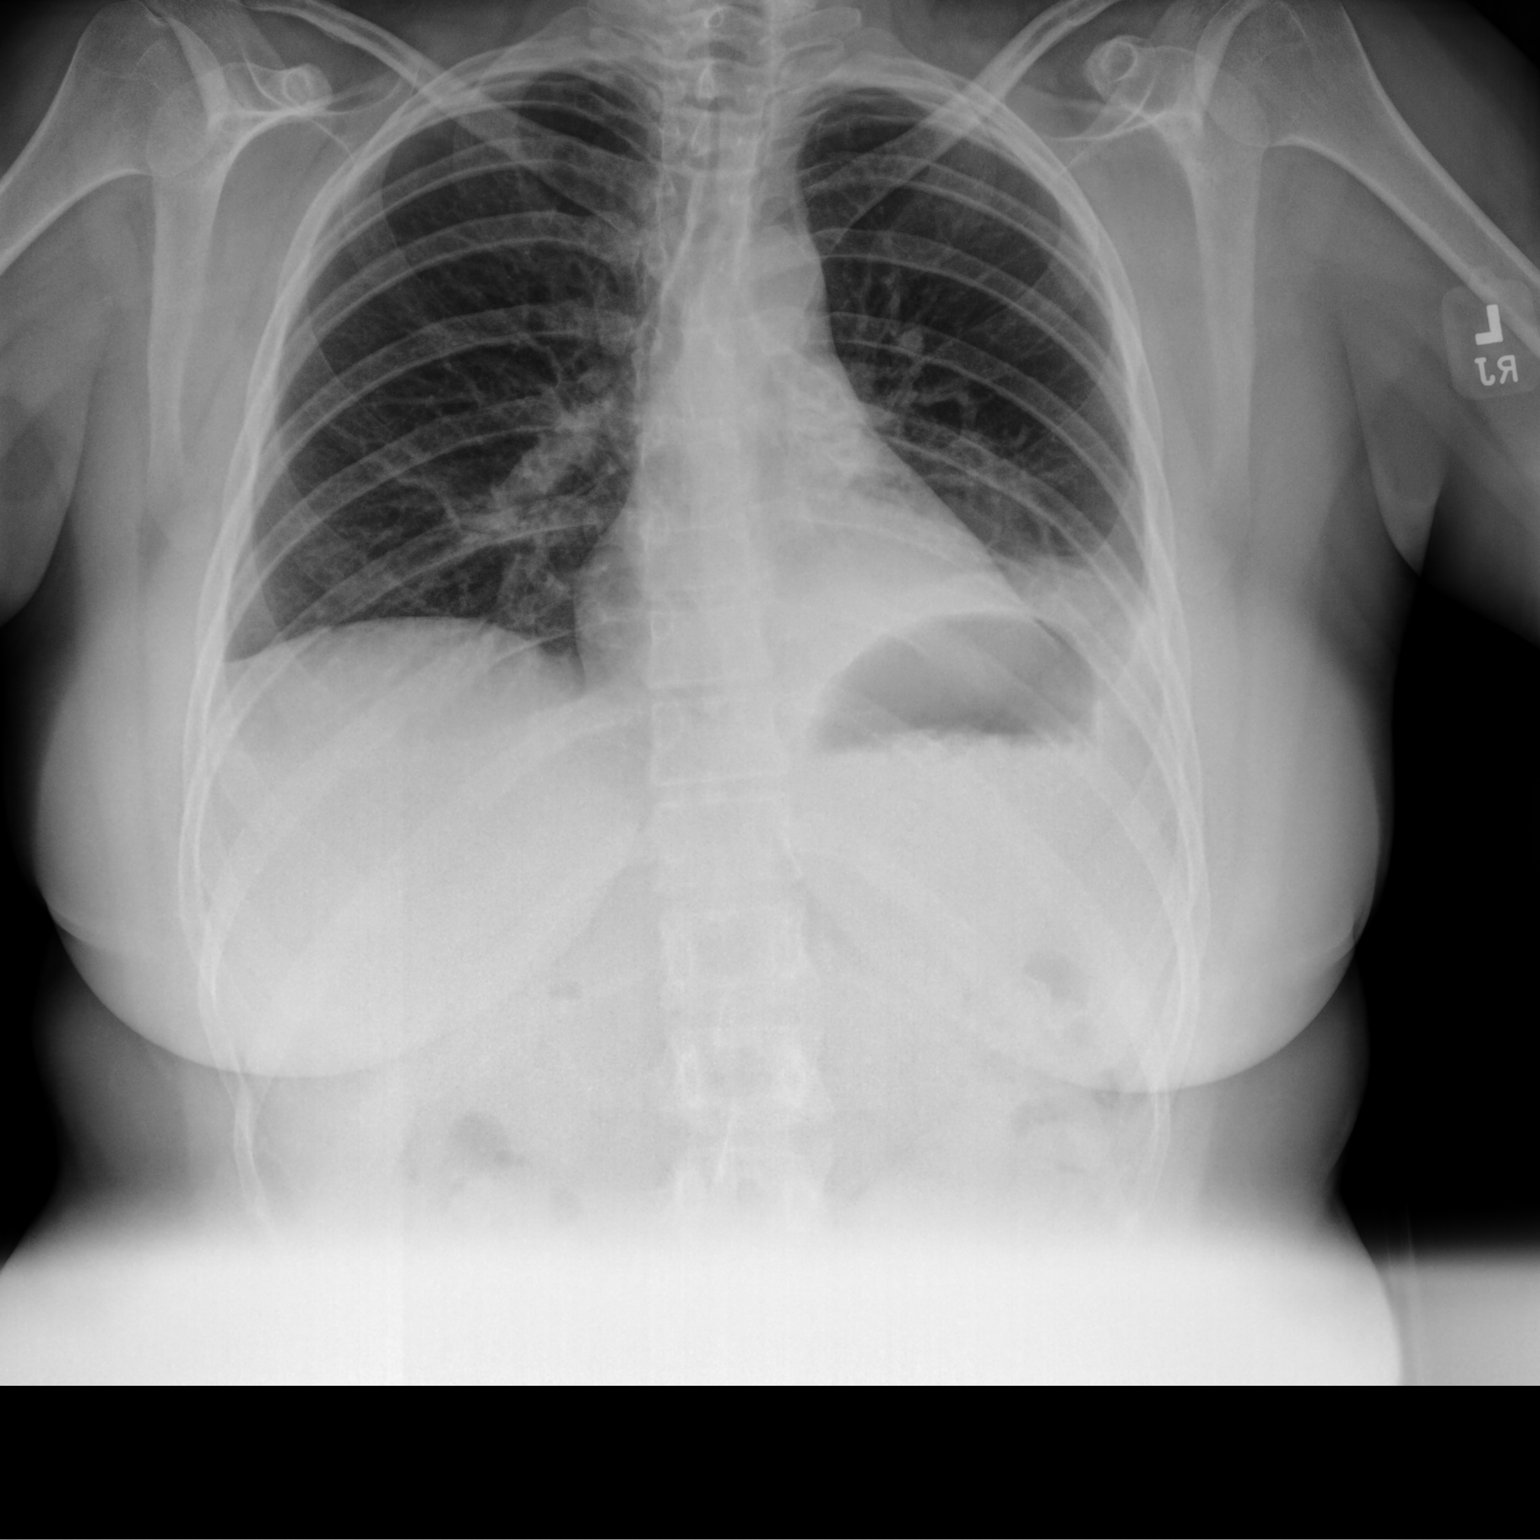

[2 of 2 positions shown; findings below may reference images not displayed]

FINDINGS: Haziness in the left lung base persists, but has decreased. There is
no evidence for pneumothorax. Cardiomediastinal silhouette within
normal limits. No acute fractures.
IMPRESSION: 1. Decreasing left basilar atelectasis.
2. No pneumothorax.
